# Patient Record
Sex: Male | Born: 1985 | Race: White | Hispanic: No | Marital: Single | Smoking: Former smoker
Health system: Southern US, Community
[De-identification: ages and names within clinical notes are randomized; demographics above are authoritative.]

---

## 2008-11-03 ENCOUNTER — Emergency Department (HOSPITAL_COMMUNITY): Admission: EM | Admit: 2008-11-03 | Discharge: 2008-11-03 | Payer: Self-pay | Admitting: Emergency Medicine

## 2014-10-22 ENCOUNTER — Emergency Department (INDEPENDENT_AMBULATORY_CARE_PROVIDER_SITE_OTHER)
Admission: EM | Admit: 2014-10-22 | Discharge: 2014-10-22 | Disposition: A | Discharge status: Home / Self Care | Payer: Managed Care, Other (non HMO) | Source: Home / Self Care | Attending: Family Medicine | Admitting: Family Medicine

## 2014-10-22 DIAGNOSIS — J029 Acute pharyngitis, unspecified: Secondary | ICD-10-CM

## 2014-10-22 LAB — POCT RAPID STREP A (OFFICE): Rapid Strep A Screen: NEGATIVE

## 2014-10-22 MED ORDER — PENICILLIN V POTASSIUM 500 MG PO TABS: ORAL_TABLET | ORAL | Status: DC

## 2014-10-22 NOTE — ED Notes (Signed)
Patient complains of sore throat, muscle pain x 1 day, state his nephew recently was diagnosed with strep throat

## 2014-10-22 NOTE — ED Provider Notes (Signed)
CSN: [id number]     Arrival date & time 10/22/14  1032 History   First MD Initiated Contact with Patient 10/22/14 1103     Chief Complaint  Patient presents with  . Sore Throat      HPI Comments: Yesterday patient developed dizziness, fatigue, sweats, myalgias, headache, nausea, and one episode of vomiting.  Today he awoke with sore throat.  No cough or nasal congestion.  His 29 year-old nephew has documented strep pharyngitis.  The history is provided by the patient.    History reviewed. No pertinent past medical history. History reviewed. No pertinent past surgical history. History reviewed. No pertinent family history. Social History  Substance Use Topics  . Smoking status: Former Games developer  . Smokeless tobacco: Never Used  . Alcohol Use: No    Review of Systems + sore throat No cough No pleuritic pain No wheezing No nasal congestion No post-nasal drainage No sinus pain/pressure No itchy/red eyes No earache + Dizziness No hemoptysis No SOB ? fever, + chills/sweats + nausea No vomiting + abdominal pain No diarrhea No urinary symptoms No skin rash + fatigue + myalgias + headache Used OTC meds without relief  Allergies  Review of patient's allergies indicates no known allergies.  Home Medications   Prior to Admission medications   Medication Sig Start Date End Date Taking? Authorizing Provider  penicillin v potassium (VEETID) 500 MG tablet Take one tab by mouth twice daily for 10 days 10/22/14   Lattie Haw, MD   Meds Ordered and Administered this Visit  Medications - No data to display  BP 102/58 mmHg  Pulse 82  Temp(Src) 98.3 F (36.8 C) (Oral)  Ht  (1.676 m)  Wt 108 lb (48.988 kg)  BMI 17.44 kg/m2  SpO2 100% No data found.   Physical Exam Nursing notes and Vital Signs reviewed. Appearance:  Patient appears stated age, and in no acute distress Eyes:  Pupils are equal, round, and reactive to light and accomodation.  Extraocular movement is  intact.  Conjunctivae are not inflamed  Ears:  Canals normal.  Tympanic membranes normal.  Nose:   Normal turbinates.  No sinus tenderness.   Pharynx:  Erythematous and slightly swollen without obstruction. Bilateral exudate.  Uvula erythematous. Neck:  Supple.  Tender shotty tonsillar nodes.  Shotty nontender posterior nodes. Lungs:  Clear to auscultation.  Breath sounds are equal.  Moving air well. Heart:  Regular rate and rhythm without murmurs, rubs, or gallops.  Abdomen:  Nontender without masses or hepatosplenomegaly.  Bowel sounds are present.  No CVA or flank tenderness.  Extremities:  No edema.   Skin:  No rash present.   ED Course  Procedures none    Labs Reviewed  POCT RAPID STREP A (OFFICE) - Normal  STREP A DNA PROBE     MDM   1. Acute pharyngitis, unspecified pharyngitis type    CENTOR score 4 Begin empiric penicillin VK Throat culture pending Try warm salt water gargles for sore throat.  May take Ibuprofen , 4 tabs every 8 hours with food for sore throat, headache, fever, etc. Followup with Family Doctor if not improved in 10 days.  Lattie Haw, MD 10/22/14 [phone]

## 2014-10-22 NOTE — Discharge Instructions (Signed)
Try warm salt water gargles for sore throat.  May take Ibuprofen , 4 tabs every 8 hours with food for sore throat, headache, fever, etc.   Salt Water Gargle This solution will help make your mouth and throat feel better. HOME CARE INSTRUCTIONS   Mix 1 teaspoon of salt in 8 ounces of warm water.  Gargle with this solution as much or often as you need or as directed. Swish and gargle gently if you have any sores or wounds in your mouth.  Do not swallow this mixture. Document Released: 11/02/2003 Document Revised: 04/22/2011 Document Reviewed: 03/25/2008 [facility] Patient Information 2015 [place], [place]. This information is not intended to replace advice given to you by your health care provider. Make sure you discuss any questions you have with your health care provider.

## 2014-10-23 LAB — STREP A DNA PROBE: GASP: POSITIVE

## 2015-02-22 ENCOUNTER — Encounter: Payer: Self-pay | Admitting: *Deleted

## 2015-02-22 ENCOUNTER — Emergency Department (INDEPENDENT_AMBULATORY_CARE_PROVIDER_SITE_OTHER)
Admission: EM | Admit: 2015-02-22 | Discharge: 2015-02-22 | Disposition: A | Discharge status: Home / Self Care | Payer: Managed Care, Other (non HMO) | Source: Home / Self Care | Attending: Family Medicine | Admitting: Family Medicine

## 2015-02-22 DIAGNOSIS — J069 Acute upper respiratory infection, unspecified: Secondary | ICD-10-CM

## 2015-02-22 DIAGNOSIS — B9789 Other viral agents as the cause of diseases classified elsewhere: Principal | ICD-10-CM

## 2015-02-22 MED ORDER — BENZONATATE 200 MG PO CAPS: 200.0000 mg | ORAL_CAPSULE | Freq: Every day | ORAL | Status: DC

## 2015-02-22 MED ORDER — AZITHROMYCIN 250 MG PO TABS: ORAL_TABLET | ORAL | Status: DC

## 2015-02-22 NOTE — ED Notes (Signed)
Pt c/o 2 days of runny nose, watery eyes, sneezing and cough. Taken Claritin and mucinex otc.

## 2015-02-22 NOTE — Discharge Instructions (Signed)
Continue plain guaifenesin (1200mg  extended release tabs such as Mucinex) twice daily, with plenty of water, for cough and congestion.  May add Pseudoephedrine (30mg , one or two every 4 to 6 hours) for sinus congestion.  Get adequate rest.   May use Afrin nasal spray (or generic oxymetazoline) twice daily for about 5 days and then discontinue.  Also recommend using saline nasal spray several times daily and saline nasal irrigation (AYR is a common brand).  Use Flonase nasal spray each morning after using Afrin nasal spray and saline nasal irrigation. May use Visine eye drops as needed. Try warm salt water gargles for sore throat.  Stop all antihistamines for now, and other non-prescription cough/cold preparations. May take Ibuprofen 200mg , 4 tabs every 8 hours with food for headache, fever, etc. Begin Azithromycin if not improving about one week or if persistent fever develops   Follow-up with family doctor if not improving about10 days.

## 2015-02-22 NOTE — ED Provider Notes (Signed)
CSN: [id number]     Arrival date & time 02/22/15  1344 History   First MD Initiated Contact with Patient 02/22/15 1433     Chief Complaint  Patient presents with  . Nasal Congestion  . Cough  . Eye Drainage      HPI Comments: Patient complains of two day history of typical cold-like symptoms including sinus congestion, headache, fatigue, sneezing, and cough.  No fevers, chills, and sweats   The history is provided by the patient.    History reviewed. No pertinent past medical history. History reviewed. No pertinent past surgical history. History reviewed. No pertinent family history. Social History  Substance Use Topics  . Smoking status: Former Games developermoker  . Smokeless tobacco: Never Used  . Alcohol Use: No    Review of Systems No sore throat + cough No pleuritic pain + wheezing + nasal congestion + post-nasal drainage ? sinus pain/pressure + watery eyes No earache No hemoptysis No SOB No fever, + chills + nausea No vomiting No abdominal pain No diarrhea No urinary symptoms No skin rash + fatigue + myalgias + headache Used OTC meds without relief  Allergies  Review of patient's allergies indicates no known allergies.  Home Medications   Prior to Admission medications   Medication Sig Start Date End Date Taking? Authorizing Provider  azithromycin (ZITHROMAX Z-PAK) 250 MG tablet Take 2 tabs today; then begin one tab once daily for 4 more days. (Rx void after 03/02/15) 02/22/15   Lattie HawStephen A Shafiq Larch, MD  benzonatate (TESSALON) 200 MG capsule Take 1 capsule (200 mg total) by mouth at bedtime. Take as needed for cough 02/22/15   Lattie HawStephen A Aleja Yearwood, MD   Meds Ordered and Administered this Visit  Medications - No data to display  BP 142/82 mmHg  Pulse 73  Temp(Src) 98.2 F (36.8 C) (Oral)  Resp 16  Wt 115 lb (52.164 kg)  SpO2 99% No data found.   Physical Exam Nursing notes and Vital Signs reviewed. Appearance:  Patient appears stated age, and in no acute  distress Eyes:  Pupils are equal, round, and reactive to light and accomodation.  Extraocular movement is intact.  Conjunctivae are not inflamed  Ears:  Canals normal.  Tympanic membranes normal.  Nose:  Congested turbinates.  No sinus tenderness.   Pharynx:  Normal Neck:  Supple.  Tender enlarged posterior nodes are palpated bilaterally  Lungs:  Clear to auscultation.  Breath sounds are equal.  Moving air well. Heart:  Regular rate and rhythm without murmurs, rubs, or gallops.  Abdomen:  Nontender without masses or hepatosplenomegaly.  Bowel sounds are present.  No CVA or flank tenderness.  Extremities:  No edema.  Skin:  No rash present.   ED Course  Procedures  None    MDM   1. Viral URI with cough    There is no evidence of bacterial infection today.  Treat symptomatically for now  Prescription written for Benzonatate (Tessalon) to take at bedtime for night-time cough.  Continue plain guaifenesin (1200mg  extended release tabs such as Mucinex) twice daily, with plenty of water, for cough and congestion.  May add Pseudoephedrine (30mg , one or two every 4 to 6 hours) for sinus congestion.  Get adequate rest.   May use Afrin nasal spray (or generic oxymetazoline) twice daily for about 5 days and then discontinue.  Also recommend using saline nasal spray several times daily and saline nasal irrigation (AYR is a common brand).  Use Flonase nasal spray each morning after using  Afrin nasal spray and saline nasal irrigation. May use Visine eye drops as needed. Try warm salt water gargles for sore throat.  Stop all antihistamines for now, and other non-prescription cough/cold preparations. May take Ibuprofen 200mg , 4 tabs every 8 hours with food for headache, fever, etc. Begin Azithromycin if not improving about one week or if persistent fever develops (Given a prescription to hold, with an expiration date)  Follow-up with family doctor if not improving about10 days.     Lattie Haw,  MD 02/22/15 [phone]

## 2015-07-05 DIAGNOSIS — S43432D Superior glenoid labrum lesion of left shoulder, subsequent encounter: Secondary | ICD-10-CM | POA: Insufficient documentation

## 2015-07-30 DIAGNOSIS — M19019 Primary osteoarthritis, unspecified shoulder: Secondary | ICD-10-CM | POA: Insufficient documentation

## 2016-04-18 ENCOUNTER — Encounter: Payer: Self-pay | Admitting: Osteopathic Medicine

## 2016-04-18 ENCOUNTER — Ambulatory Visit (INDEPENDENT_AMBULATORY_CARE_PROVIDER_SITE_OTHER): Payer: Managed Care, Other (non HMO) | Admitting: Osteopathic Medicine

## 2016-04-18 VITALS — BP 132/75 | HR 62 | Ht 66.0 in | Wt 124.0 lb

## 2016-04-18 DIAGNOSIS — Z008 Encounter for other general examination: Secondary | ICD-10-CM

## 2016-04-18 DIAGNOSIS — Z0189 Encounter for other specified special examinations: Secondary | ICD-10-CM

## 2016-04-18 DIAGNOSIS — R740 Nonspecific elevation of levels of transaminase and lactic acid dehydrogenase [LDH]: Secondary | ICD-10-CM

## 2016-04-18 DIAGNOSIS — Z0000 Encounter for general adult medical examination without abnormal findings: Secondary | ICD-10-CM | POA: Diagnosis not present

## 2016-04-18 DIAGNOSIS — R7401 Elevation of levels of liver transaminase levels: Secondary | ICD-10-CM

## 2016-04-18 NOTE — Progress Notes (Signed)
HPI: Luis Boyd is a 31 y.o. male  who presents to [facility] today, 04/18/16,  for chief complaint of:  Chief Complaint  Patient presents with  . Establish Care    BIOMETRIC SCREENING    Patient here for biometric screening and routine annual physical, no complaints.   Past medical, surgical, social and family history reviewed: There are no active problems to display for this patient.  History reviewed. No pertinent surgical history. Social History  Substance Use Topics  . Smoking status: Former Games developer  . Smokeless tobacco: Never Used  . Alcohol use No   History reviewed. No pertinent family history.   Current medication list and allergy/intolerance information reviewed:   No current outpatient prescriptions on file.   No current facility-administered medications for this visit.    No Known Allergies    Review of Systems:  Constitutional:  No  fever, no chills, No recent illness, No unintentional weight changes. No significant fatigue.   HEENT: No  headache, no vision change, no hearing change, No sore throat, No  sinus pressure  Cardiac: No  chest pain, No  pressure, No palpitations  Respiratory:  No  shortness of breath. No  Cough  Gastrointestinal: No  abdominal pain, No  nausea, No  vomiting,  No  blood in stool, No  diarrhea, No  constipation   Musculoskeletal: No new myalgia/arthralgia  Skin: No  Rash  Hem/Onc: No  easy bruising/bleeding, No  abnormal lymph node  Endocrine: No cold intolerance,  No heat intolerance.  Neurologic: No  weakness, No  dizziness, No  slurred speech/focal weakness/facial droop  Psychiatric: No  concerns with depression, No  concerns with anxiety, No sleep problems, No mood problems  Exam:  BP 132/75   Pulse 62   Ht 5\' 6"  (1.676 m)   Wt 124 lb (56.2 kg)   BMI 20.01 kg/m   Constitutional: VS see above. General Appearance: alert, well-developed, well-nourished, NAD  Eyes: Normal  lids and conjunctive, non-icteric sclera. PERRLA, mildly dilated...  Ears, Nose, Mouth, Throat: MMM, Normal external inspection ears/nares/mouth/lips/gums. TM normal bilaterally. Pharynx/tonsils no erythema, no exudate. Nasal mucosa normal.   Neck: No masses, trachea midline. No thyroid enlargement. No tenderness/mass appreciated. No lymphadenopathy  Respiratory: Normal respiratory effort. no wheeze, no rhonchi, no rales  Cardiovascular: S1/S2 normal, no murmur, no rub/gallop auscultated. RRR. No lower extremity edema.   Gastrointestinal: Nontender, no masses. No hepatomegaly, no splenomegaly. No hernia appreciated. Bowel sounds normal. Rectal exam deferred.   Musculoskeletal: Gait normal. No clubbing/cyanosis of digits.   Neurological: Normal balance/coordination. No tremor. No cranial nerve deficit on limited exam. Motor and sensation intact and symmetric. Cerebellar reflexes intact.   Skin: warm, dry, intact. No rash/ulcer.  Psychiatric: Normal judgment/insight. Normal mood and affect. Oriented x3.      ASSESSMENT/PLAN:   Encounter for biometric screening - Plan: COMPLETE METABOLIC PANEL WITH GFR, Lipid panel  Annual physical exam   MALE PREVENTIVE CARE  updated 04/18/16  ANNUAL SCREENING/COUNSELING  Any changes to health in the past year? no  Diet/Exercise - HEALTHY HABITS DISCUSSED TO DECREASE CV RISK History  Smoking Status  . Former Smoker  Smokeless Tobacco  . Never Used   History  Alcohol Use No   No flowsheet data found.  SEXUAL/REPRODUCTIVE HEALTH  STI testing needed/desired today? - no  Any concerns with testosterone/libido? - no  INFECTIOUS DISEASE SCREENING  HIV - needs - declined  GC/CT - needs - declined  HepC -  does not need  TB - does not need  CANCER SCREENING  Lung - USPSTF: 55-80yo w/ 30 py hx unless quit w/in 463yr - does not need  Colon - does not need  Prostate - does not need  OTHER DISEASE SCREENING  Lipid -  needs  DM2 - needs  ADULT VACCINATION  Influenza - annual vaccine recommended  Td - booster every 10 years   Zoster - option at 450, yes at 60+   PCV13 - was not indicated  PPSV23 - was not indicated Immunization History  Administered Date(s) Administered  . Influenza-Unspecified 12/27/2015  . Tdap 05/20/2015      Visit summary with medication list and pertinent instructions was printed for patient to review. All questions at time of visit were answered - patient instructed to contact office with any additional concerns. ER/RTC precautions were reviewed with the patient. Follow-up plan: Return in about 1 year (around 04/18/2017) [facility] sooner if needed.

## 2016-04-19 LAB — COMPLETE METABOLIC PANEL WITH GFR
ALBUMIN: 4.1 g/dL (ref 3.6–5.1)
ALT: 98 U/L — AB (ref 9–46)
AST: 57 U/L — AB (ref 10–40)
Alkaline Phosphatase: 38 U/L — ABNORMAL LOW (ref 40–115)
BUN: 13 mg/dL (ref 7–25)
CALCIUM: 9.1 mg/dL (ref 8.6–10.3)
CO2: 30 mmol/L (ref 20–31)
CREATININE: 1.16 mg/dL (ref 0.60–1.35)
Chloride: 100 mmol/L (ref 98–110)
GFR, Est African American: >89 mL/min (ref >=60)
GFR, Est Non African American: 84 mL/min (ref >=60)
GLUCOSE: 83 mg/dL (ref 65–99)
Potassium: 4.3 mmol/L (ref 3.5–5.3)
SODIUM: 140 mmol/L (ref 135–146)
TOTAL PROTEIN: 6.9 g/dL (ref 6.1–8.1)
Total Bilirubin: 0.7 mg/dL (ref 0.2–1.2)

## 2016-04-19 LAB — LIPID PANEL
CHOL/HDL RATIO: 4 ratio (ref <5.0)
CHOLESTEROL: 93 mg/dL (ref <200)
HDL: 23 mg/dL — ABNORMAL LOW (ref >40)
LDL Cholesterol: 60 mg/dL (ref <100)
Triglycerides: 51 mg/dL (ref <150)
VLDL: 10 mg/dL (ref <30)

## 2016-04-19 NOTE — Addendum Note (Signed)
Addended by: Deirdre PippinsALEXANDER, Legacy Lacivita M on: 04/19/2016 08:18 AM   Modules accepted: Orders

## 2017-05-06 ENCOUNTER — Ambulatory Visit (INDEPENDENT_AMBULATORY_CARE_PROVIDER_SITE_OTHER): Payer: 59 | Admitting: Osteopathic Medicine

## 2017-05-06 ENCOUNTER — Encounter: Payer: Self-pay | Admitting: Osteopathic Medicine

## 2017-05-06 VITALS — BP 124/75 | HR 63 | Temp 97.8°F | Ht 66.0 in | Wt 131.0 lb

## 2017-05-06 DIAGNOSIS — Z008 Encounter for other general examination: Secondary | ICD-10-CM

## 2017-05-06 DIAGNOSIS — Z0189 Encounter for other specified special examinations: Secondary | ICD-10-CM | POA: Diagnosis not present

## 2017-05-06 DIAGNOSIS — Z0000 Encounter for general adult medical examination without abnormal findings: Secondary | ICD-10-CM

## 2017-05-06 LAB — LIPID PANEL
CHOLESTEROL: 98 mg/dL (ref <200)
HDL: 39 mg/dL — AB (ref >40)
LDL CHOLESTEROL (CALC): 50 mg/dL
NON-HDL CHOLESTEROL (CALC): 59 mg/dL (ref <130)
Total CHOL/HDL Ratio: 2.5 (calc) (ref <5.0)
Triglycerides: 32 mg/dL (ref <150)

## 2017-05-06 LAB — COMPLETE METABOLIC PANEL WITH GFR
AG RATIO: 1.8 (calc) (ref 1.0–2.5)
ALT: 27 U/L (ref 9–46)
AST: 27 U/L (ref 10–40)
Albumin: 4.4 g/dL (ref 3.6–5.1)
Alkaline phosphatase (APISO): 50 U/L (ref 40–115)
BILIRUBIN TOTAL: 0.8 mg/dL (ref 0.2–1.2)
BUN: 20 mg/dL (ref 7–25)
CO2: 28 mmol/L (ref 20–32)
Calcium: 9.2 mg/dL (ref 8.6–10.3)
Chloride: 103 mmol/L (ref 98–110)
Creat: 1.14 mg/dL (ref 0.60–1.35)
GFR, EST AFRICAN AMERICAN: 99 mL/min/{1.73_m2} (ref >=60)
GFR, Est Non African American: 85 mL/min/{1.73_m2} (ref >=60)
Globulin: 2.5 g/dL (calc) (ref 1.9–3.7)
Glucose, Bld: 87 mg/dL (ref 65–99)
Potassium: 4 mmol/L (ref 3.5–5.3)
Sodium: 139 mmol/L (ref 135–146)
TOTAL PROTEIN: 6.9 g/dL (ref 6.1–8.1)

## 2017-05-06 LAB — CBC
HCT: 43.4 % (ref 38.5–50.0)
Hemoglobin: 15.1 g/dL (ref 13.2–17.1)
MCH: 31.7 pg (ref 27.0–33.0)
MCHC: 34.8 g/dL (ref 32.0–36.0)
MCV: 91.2 fL (ref 80.0–100.0)
MPV: 9.1 fL (ref 7.5–12.5)
PLATELETS: 241 10*3/uL (ref 140–400)
RBC: 4.76 10*6/uL (ref 4.20–5.80)
RDW: 12.6 % (ref 11.0–15.0)
WBC: 5.3 10*3/uL (ref 3.8–10.8)

## 2017-05-06 NOTE — Progress Notes (Signed)
HPI: Luis Boyd is a 32 y.o. male  who presents to [facility] today, 05/06/17,  for chief complaint of:  Annual checkup and biometric screening   Patient here for biometric screening and routine annual physical, no complaints. See below for review of preventive care.   (+)PHQ, pt is not too worried about things, just feeling a little down at times. We talked about the options for medications +/- counseling, he'd like to think about this for now.   Past medical, surgical, social and family history reviewed: There are no active problems to display for this patient.  No past surgical history on file. Social History   Tobacco Use  . Smoking status: Former Games developer  . Smokeless tobacco: Never Used  Substance Use Topics  . Alcohol use: No   Family History  Problem Relation Age of Onset  . Hypertension Brother      Current medication list and allergy/intolerance information reviewed:   No current outpatient medications on file.   No current facility-administered medications for this visit.    No Known Allergies    Review of Systems:  Constitutional:  No  fever, no chills, No recent illness, No unintentional weight changes. No significant fatigue.   HEENT: No  headache, no vision change, no hearing change, No sore throat, No  sinus pressure  Cardiac: No  chest pain, No  pressure, No palpitations  Respiratory:  No  shortness of breath. No  Cough  Gastrointestinal: No  abdominal pain, No  nausea, No  vomiting,  No  blood in stool, No  diarrhea, No  constipation   Musculoskeletal: No new myalgia/arthralgia  Skin: No  Rash  Hem/Onc: No  easy bruising/bleeding, No  abnormal lymph node  Endocrine: No cold intolerance,  No heat intolerance.  Neurologic: No  weakness, No  dizziness, No  slurred speech/focal weakness/facial droop  Psychiatric: +concerns with depression, No  concerns with anxiety, No sleep problems, No mood  problems  Depression screen [facility] 2/9 05/06/2017  Decreased Interest 2  Down, Depressed, Hopeless 2  PHQ - 2 Score 4  Altered sleeping 0  Tired, decreased energy 1  Change in appetite 0  Feeling bad or failure about yourself  0  Trouble concentrating 0  Moving slowly or fidgety/restless 0  Suicidal thoughts 0  PHQ-9 Score 5  Difficult doing work/chores Not difficult at all     Exam:  BP 124/75 (BP Location: Left Arm, Patient Position: Sitting, Cuff Size: Normal)   Pulse 63   Temp 97.8 F (36.6 C) (Oral)   Ht 5\' 6"  (1.676 m)   Wt 131 lb 0.6 oz (59.4 kg)   BMI 21.15 kg/m   Constitutional: VS see above. General Appearance: alert, well-developed, well-nourished, NAD  Eyes: Normal lids and conjunctive, non-icteric sclera. PERRLA, mildly dilated...  Ears, Nose, Mouth, Throat: MMM, Normal external inspection ears/nares/mouth/lips/gums. TM normal bilaterally. Pharynx/tonsils no erythema, no exudate. Nasal mucosa normal.   Neck: No masses, trachea midline. No thyroid enlargement. No tenderness/mass appreciated. No lymphadenopathy  Respiratory: Normal respiratory effort. no wheeze, no rhonchi, no rales  Cardiovascular: S1/S2 normal, no murmur, no rub/gallop auscultated. RRR. No lower extremity edema.   Gastrointestinal: Nontender, no masses. No hepatomegaly, no splenomegaly. No hernia appreciated. Bowel sounds normal. Rectal exam deferred.   Musculoskeletal: Gait normal. No clubbing/cyanosis of digits.   Neurological: Normal balance/coordination. No tremor. No cranial nerve deficit on limited exam. Motor and sensation intact and symmetric. Cerebellar reflexes intact.   Skin: warm,  dry, intact. No rash/ulcer.  Psychiatric: Normal judgment/insight. Normal mood and affect. Oriented x3.      ASSESSMENT/PLAN:   Annual physical exam - Plan: CBC, COMPLETE METABOLIC PANEL WITH GFR, Lipid panel  Encounter for biometric screening - Plan: CBC, COMPLETE METABOLIC PANEL WITH GFR, Lipid  panel  Patient Instructions  Plan: If you're doing well, come see me in a year!  If depression/mood is something you'd like to talk about more, please come see me and we can go over medication options, counseling, etc.       MALE PREVENTIVE CARE  updated 05/06/17  ANNUAL SCREENING/COUNSELING  Any changes to health in the past year? no  Diet/Exercise - HEALTHY HABITS DISCUSSED TO DECREASE CV RISK Social History   Tobacco Use  Smoking Status Former Smoker  Smokeless Tobacco Never Used   Social History   Substance and Sexual Activity  Alcohol Use No   No flowsheet data found.  SEXUAL/REPRODUCTIVE HEALTH  STI testing needed/desired today? - no  Any concerns with testosterone/libido? - no  INFECTIOUS DISEASE SCREENING  HIV - needs - declined  GC/CT - needs - declined  HepC - does not need  TB - does not need  CANCER SCREENING  Lung - does not need  Colon - does not need  Prostate - does not need  OTHER DISEASE SCREENING  Lipid - needs  DM2 - needs  ADULT VACCINATION  Influenza - annual vaccine recommended  Td - booster every 10 years   Zoster - option at 3050, yes at 60+   PCV13 - was not indicated  PPSV23 - was not indicated Immunization History  Administered Date(s) Administered  . Influenza-Unspecified 12/27/2015, 12/12/2016  . Tdap 05/20/2015      Visit summary with medication list and pertinent instructions was printed for patient to review. All questions at time of visit were answered - patient instructed to contact office with any additional concerns. ER/RTC precautions were reviewed with the patient. Follow-up plan: Return in about 1 year (around 05/07/2018) for annual physical, sooner if needed .

## 2017-05-06 NOTE — Patient Instructions (Signed)
Plan: If you're doing well, come see me in a year!  If depression/mood is something you'd like to talk about more, please come see me and we can go over medication options, counseling, etc.

## 2017-07-18 ENCOUNTER — Encounter: Payer: Self-pay | Admitting: Physician Assistant

## 2017-07-18 ENCOUNTER — Ambulatory Visit (INDEPENDENT_AMBULATORY_CARE_PROVIDER_SITE_OTHER): Payer: 59 | Admitting: Physician Assistant

## 2017-07-18 VITALS — BP 125/87 | HR 75 | Temp 98.0°F | Resp 14 | Wt 126.0 lb

## 2017-07-18 DIAGNOSIS — J069 Acute upper respiratory infection, unspecified: Secondary | ICD-10-CM | POA: Diagnosis not present

## 2017-07-18 LAB — POCT RAPID STREP A (OFFICE): RAPID STREP A SCREEN: NEGATIVE

## 2017-07-18 MED ORDER — BENZONATATE 200 MG PO CAPS
200.0000 mg | ORAL_CAPSULE | Freq: Three times a day (TID) | ORAL | 0 refills | Status: DC | PRN
Start: 2017-07-18 — End: 2018-07-10

## 2017-07-18 NOTE — Patient Instructions (Signed)
For sore throat: - Tylenol 1000mg  every 8 hours as needed for throat pain. Alternate with Ibuprofen 600mg  every 6 hours - Cepacol throat lozenges and/or Chloraseptic spray - Warm salt water gargles  For cough: - Mucinex with at least 8 oz. of water to make cough more productive - Delsym or Roitussin to help suppress cough  Note: follow package instructions for all over-the-counter medications. If using multi-symptom medications (Dayquil, Theraflu, etc.), check the label for duplicate drug ingredients.

## 2017-07-18 NOTE — Progress Notes (Signed)
HPI:                                                                Luis PaliWilliam D Boyd is a 32 y.o. male [facility] today for URI  URI   This is a new problem. The current episode started in the past 7 days (x3 days). The problem has been unchanged. The maximum temperature recorded prior to his arrival was 102 - 102.9 F. Associated symptoms include coughing and a sore throat. Pertinent negatives include no congestion, diarrhea, ear pain, nausea, rash, rhinorrhea or wheezing. Associated symptoms comments: + diaphoresis + malaise + myalgia. Treatments tried: Nyquil, Dayquil.    History reviewed. No pertinent past medical history. History reviewed. No pertinent surgical history. Social History   Tobacco Use  . Smoking status: Former Smoker    Last attempt to quit: 2012    Years since quitting: 7.4  . Smokeless tobacco: Never Used  Substance Use Topics  . Alcohol use: No   family history includes Hypertension in his brother.    ROS: negative except as noted in the HPI  Medications: Current Outpatient Medications  Medication Sig Dispense Refill  . benzonatate (TESSALON) 200 MG capsule Take 1 capsule (200 mg total) by mouth 3 (three) times daily as needed for cough. 30 capsule 0   No current facility-administered medications for this visit.    No Known Allergies     Objective:  BP 125/87   Pulse 75   Temp 98 F (36.7 C) (Oral)   Resp 14   Wt 126 lb (57.2 kg)   SpO2 99%   BMI 20.34 kg/m  Gen:  alert, not ill-appearing, no distress, appropriate for age HEENT: head normocephalic without obvious abnormality, conjunctiva and cornea clear, wearing glasses, oropharynx with mild erythema, no edema or exudates, uvula midline, neck supple, no cervical adenopathy, mucous membranes are dry, trachea midline Pulm: Normal work of breathing, normal phonation, clear to auscultation bilaterally, no wheezes, rales or  rhonchi CV: Normal rate, regular rhythm, s1 and s2 distinct, no murmurs, clicks or rubs  Neuro: alert and oriented x 3, no tremor MSK: extremities atraumatic, normal gait and station Skin: intact, no rashes on exposed skin, no jaundice, no cyanosis    Results for orders placed or performed in visit on 07/18/17 (from the past 72 hour(s))  POCT rapid strep A     Status: Normal   Collection Time: 07/18/17 11:45 AM  Result Value Ref Range   Rapid Strep A Screen Negative Negative   No results found.    Assessment and Plan: 32 y.o. male with   Acute upper respiratory infection - Plan: benzonatate (TESSALON) 200 MG capsule, POCT rapid strep A - POCT Rapid Strep A negative. Afebrile, no tachycardia, no tachypnea.  - Supportive care. Encouraged PO hydration     Patient education and anticipatory guidance given Patient agrees with treatment plan Follow-up as needed if symptoms worsen or fail to improve  Levonne Hubertharley E. Cummings PA-C

## 2018-07-10 ENCOUNTER — Ambulatory Visit (INDEPENDENT_AMBULATORY_CARE_PROVIDER_SITE_OTHER): Payer: 59 | Admitting: Family Medicine

## 2018-07-10 ENCOUNTER — Encounter: Payer: Self-pay | Admitting: Family Medicine

## 2018-07-10 VITALS — BP 130/79 | HR 61 | Temp 97.6°F | Wt 121.0 lb

## 2018-07-10 DIAGNOSIS — S39012A Strain of muscle, fascia and tendon of lower back, initial encounter: Secondary | ICD-10-CM | POA: Diagnosis not present

## 2018-07-10 MED ORDER — CYCLOBENZAPRINE HCL 10 MG PO TABS: 10.0000 mg | ORAL_TABLET | Freq: Three times a day (TID) | ORAL | 0 refills | Status: DC | PRN

## 2018-07-10 MED ORDER — NAPROXEN 500 MG PO TABS: 500.0000 mg | ORAL_TABLET | Freq: Two times a day (BID) | ORAL | 3 refills | Status: DC

## 2018-07-10 NOTE — Patient Instructions (Signed)
Thank you for coming in today. Attend PT.  Use heating pad, and TENS unit.  Take naproxen twice daily as needed.  Use cyclobenzaprine muscle relaxer 3x daily as needed (mostly at bedtime as it can make you sleepy).   TENS UNIT: This is helpful for muscle pain and spasm.   Search and Purchase a TENS 7000 2nd edition at  www.tenspros.com or www.Amazon.com It should be less than $30.     TENS unit instructions: Do not shower or bathe with the unit on Turn the unit off before removing electrodes or batteries If the electrodes lose stickiness add a drop of water to the electrodes after they are disconnected from the unit and place on plastic sheet. If you continued to have difficulty, call the TENS unit company to purchase more electrodes. Do not apply lotion on the skin area prior to use. Make sure the skin is clean and dry as this will help prolong the life of the electrodes. After use, always check skin for unusual red areas, rash or other skin difficulties. If there are any skin problems, does not apply electrodes to the same area. Never remove the electrodes from the unit by pulling the wires. Do not use the TENS unit or electrodes other than as directed. Do not change electrode placement without consultating your therapist or physician. Keep 2 fingers with between each electrode. Wear time ratio is 2:1, on to off times.    For example on for 30 minutes off for 15 minutes and then on for 30 minutes off for 15 minutes     Lumbosacral Strain Lumbosacral strain is an injury that causes pain in the lower back (lumbosacral spine). This injury usually occurs from overstretching the muscles or ligaments along your spine. A strain can affect one or more muscles or cord-like tissues that connect bones to other bones (ligaments). What are the causes? This condition may be caused by:  A hard, direct hit (blow) to the back.  Excessive stretching of the lower back muscles. This may result  from: ? A fall. ? Lifting something heavy. ? Repetitive movements such as bending or crouching. What increases the risk? The following factors may increase your risk of getting this condition:  Participating in sports or activities that involve: ? A sudden twist of the back. ? Pushing or pulling motions.  Being overweight or obese.  Having poor strength and flexibility, especially tight hamstrings or weak muscles in the back or abdomen.  Having too much of a curve in the lower back.  Having a pelvis that is tilted forward. What are the signs or symptoms? The main symptom of this condition is pain in the lower back, at the site of the strain. Pain may extend (radiate) down one or both legs. How is this diagnosed? This condition is diagnosed based on:  Your symptoms.  Your medical history.  A physical exam. ? Your health care provider may push on certain areas of your back to determine the source of your pain. ? You may be asked to bend forward, backward, and side to side to assess the severity of your pain and your range of motion.  Imaging tests, such as: ? X-rays. ? MRI.  How is this treated? Treatment for this condition may include:  Putting heat and cold on the affected area.  Medicines to help relieve pain and relax your muscles (muscle relaxants).  NSAIDs to help reduce swelling and discomfort. When your symptoms improve, it is important to gradually return  to your normal routine as soon as possible to reduce pain, avoid stiffness, and avoid loss of muscle strength. Generally, symptoms should improve within 6 weeks of treatment. However, recovery time varies. Follow these instructions at home: Managing pain, stiffness, and swelling   If directed, put ice on the injured area during the first 24 hours after your strain. ? Put ice in a plastic bag. ? Place a towel between your skin and the bag. ? Leave the ice on for 20 minutes, 2-3 times a day.  If directed,  put heat on the affected area as often as told by your health care provider. Use the heat source that your health care provider recommends, such as a moist heat pack or a heating pad. ? Place a towel between your skin and the heat source. ? Leave the heat on for 20-30 minutes. ? Remove the heat if your skin turns bright red. This is especially important if you are unable to feel pain, heat, or cold. You may have a greater risk of getting burned. Activity  Rest and return to your normal activities as told by your health care provider. Ask your health care provider what activities are safe for you.  Avoid activities that take a lot of energy for as long as told by your health care provider. General instructions  Take over-the-counter and prescription medicines only as told by your health care provider.  Donot drive or use heavy machinery while taking prescription pain medicine.  Do not use any products that contain nicotine or tobacco, such as cigarettes and e-cigarettes. If you need help quitting, ask your health care provider.  Keep all follow-up visits as told by your health care provider. This is important. How is this prevented?  Use correct form when playing sports and lifting heavy objects.  Use good posture when sitting and standing.  Maintain a healthy weight.  Sleep on a mattress with medium firmness to support your back.  Be safe and responsible while being active to avoid falls.  Do at least 150 minutes of moderate-intensity exercise each week, such as brisk walking or water aerobics. Try a form of exercise that takes stress off your back, such as swimming or stationary cycling.  Maintain physical fitness, including: ? Strength. ? Flexibility. ? Cardiovascular fitness. ? Endurance. Contact a health care provider if:  Your back pain does not improve after 6 weeks of treatment.  Your symptoms get worse. Get help right away if:  Your back pain is severe.  You  cannot stand or walk.  You have difficulty controlling when you urinate or when you have a bowel movement.  You feel nauseous or you vomit.  Your feet get very cold.  You have numbness, tingling, weakness, or problems using your arms or legs.  You develop any of the following: ? Shortness of breath. ? Dizziness. ? Pain in your legs. ? Weakness in your buttocks or legs. ? Discoloration of the skin on your toes or legs. This information is not intended to replace advice given to you by your health care provider. Make sure you discuss any questions you have with your health care provider. Document Released: 11/07/2004 Document Revised: 08/18/2015 Document Reviewed: 07/02/2015 Elsevier Interactive Patient Education  2019 [employer].

## 2018-07-10 NOTE — Progress Notes (Signed)
Luis Boyd is a 33 y.o. male who presents to [facility] today for lower back pain.  Patient about 2 weeks ago developed a little bit of pain in his left lower back.  He notes 2 days ago it worsened quite a bit.  He notes now moderate to severe pain in her left lower back.  He denies any radiating pain weakness or numbness bowel bladder dysfunction.  Pain is worse with activity and motion.  He is tried an over-the-counter back brace and Goody powder which did not help much.  He called out of work today because his back pain was getting worse.  He is never had any thing like this happen before.  He feels well otherwise.    ROS:  As above  Exam:  BP 130/79   Pulse 61   Temp 97.6 F (36.4 C) (Oral)   Wt 121 lb (54.9 kg)   BMI 19.53 kg/m  Wt Readings from Last 5 Encounters:  07/10/18 121 lb (54.9 kg)  07/18/17 126 lb (57.2 kg)  05/06/17 131 lb 0.6 oz (59.4 kg)  04/18/16 124 lb (56.2 kg)  02/22/15 115 lb (52.2 kg)   General: Well Developed, well nourished, and in no acute distress.  Neuro/Psych: Alert and oriented x3, extra-ocular muscles intact, able to move all 4 extremities, sensation grossly intact. Skin: Warm and dry, no rashes noted.  Respiratory: Not using accessory muscles, speaking in full sentences, trachea midline.  Cardiovascular: Pulses palpable, no extremity edema. Abdomen: Does not appear distended. MSK: L-spine: Nontender to spinal midline.  Tender palpation left lumbar paraspinal musculature. Limited lumbar motion. Lower extremity strength reflexes and sensation are equal and normal throughout bilateral lower extremities. Negative slump test. Normal gait.     Assessment and Plan: 33 y.o. male with lumbosacral strain and spasm.  Plan for treatment with NSAIDs cyclobenzaprine TENS unit heating pad and physical therapy.  Work note provided.  Discussed cause of pain anatomy and next steps in treatment plan.  Recheck  if not improving.   PDMP not reviewed this encounter. Orders Placed This Encounter  Procedures  . Ambulatory referral to Physical Therapy    Referral Priority:   Routine    Referral Type:   Physical Medicine    Referral Reason:   Specialty Services Required    Requested Specialty:   Physical Therapy   Meds ordered this encounter  Medications  . naproxen (NAPROSYN) 500 MG tablet    Sig: Take 1 tablet (500 mg total) by mouth 2 (two) times daily with a meal.    Dispense:  60 tablet    Refill:  3  . cyclobenzaprine (FLEXERIL) 10 MG tablet    Sig: Take 1 tablet (10 mg total) by mouth 3 (three) times daily as needed for muscle spasms.    Dispense:  30 tablet    Refill:  0    Historical information moved to improve visibility of documentation.  History reviewed. No pertinent past medical history. History reviewed. No pertinent surgical history. Social History   Tobacco Use  . Smoking status: Former Smoker    Last attempt to quit: 2012    Years since quitting: 8.4  . Smokeless tobacco: Never Used  Substance Use Topics  . Alcohol use: No   family history includes Hypertension in his brother.  Medications: Current Outpatient Medications  Medication Sig Dispense Refill  . cyclobenzaprine (FLEXERIL) 10 MG tablet Take 1 tablet (10 mg total) by mouth 3 (three) times  daily as needed for muscle spasms. 30 tablet 0  . naproxen (NAPROSYN) 500 MG tablet Take 1 tablet (500 mg total) by mouth 2 (two) times daily with a meal. 60 tablet 3   No current facility-administered medications for this visit.    No Known Allergies    Discussed warning signs or symptoms. Please see discharge instructions. Patient expresses understanding.

## 2018-07-23 ENCOUNTER — Ambulatory Visit: Payer: 59 | Admitting: Rehabilitative and Restorative Service Providers"

## 2018-08-27 ENCOUNTER — Encounter: Payer: Self-pay | Admitting: Osteopathic Medicine

## 2018-08-27 ENCOUNTER — Other Ambulatory Visit: Payer: Self-pay

## 2018-08-27 ENCOUNTER — Ambulatory Visit (INDEPENDENT_AMBULATORY_CARE_PROVIDER_SITE_OTHER): Payer: 59 | Admitting: Osteopathic Medicine

## 2018-08-27 VITALS — BP 118/73 | HR 57 | Temp 97.8°F | Wt 122.9 lb

## 2018-08-27 DIAGNOSIS — S39012D Strain of muscle, fascia and tendon of lower back, subsequent encounter: Secondary | ICD-10-CM | POA: Diagnosis not present

## 2018-08-27 DIAGNOSIS — S161XXA Strain of muscle, fascia and tendon at neck level, initial encounter: Secondary | ICD-10-CM

## 2018-08-27 DIAGNOSIS — S39012A Strain of muscle, fascia and tendon of lower back, initial encounter: Secondary | ICD-10-CM | POA: Insufficient documentation

## 2018-08-27 MED ORDER — MELOXICAM 15 MG PO TABS: 15.0000 mg | ORAL_TABLET | Freq: Every day | ORAL | 0 refills | Status: DC

## 2018-08-27 MED ORDER — PREDNISONE 20 MG PO TABS: 20.0000 mg | ORAL_TABLET | Freq: Two times a day (BID) | ORAL | 0 refills | Status: DC

## 2018-08-27 MED ORDER — CYCLOBENZAPRINE HCL 10 MG PO TABS: 10.0000 mg | ORAL_TABLET | Freq: Three times a day (TID) | ORAL | 0 refills | Status: DC | PRN

## 2018-08-27 NOTE — Patient Instructions (Signed)
Plan:  See home exercise instructions   Stop Naproxen, will trial Mobic/meloxicam   Will trial steroid burst to see if we can get things to calm down  I refilled the Flexeril/cyclobenzaprine muscle relaxers  Check with physical therapy office down the hall   Call if not better, we can get MRI / refer to specialist if needed!

## 2018-08-27 NOTE — Progress Notes (Signed)
HPI: Luis Boyd is a 33 y.o. male who  has no past medical history on file.  he presents to [facility] [place] today, 08/27/18,  for chief complaint of:  Pain in back  . 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 Location: lower back, started radiating now to neck  . Quality: sore, tight  . Duration: 6+ weeks  Modifying factors: Naproxen helped a bit, muscle relaxers helped but he's out   At today's visit 08/27/18 ... PMH, PSH, FH reviewed and updated as needed.  Current medication list and allergy/intolerance hx reviewed and updated as needed. (See remainder of HPI, ROS, Phys Exam below)   No results found.  No results found for this or any previous visit (from the past 72 hour(s)).        ASSESSMENT/PLAN: The primary encounter diagnosis was Strain of lumbar region, subsequent encounter. A diagnosis of Strain of neck muscle, initial encounter was also pertinent to this visit.     Seems like repetitive overuse injury as opposed to skeletal/joint issue, would consider imaging but I really do not think necessary at this point.  Patient was not able to arrange for physical therapy due to financial issues, home exercises were provided today and will adjust medications.  Patient is advised he can return for OMT as needed.   OMT applied: MFR and FPR to cervical spine, thoracic spine, lumbar spine.  Some relief to cervical spine area.  Patient had some difficulty relaxing so procedure was a bit challenging.   No orders of the defined types were placed in this encounter.    Meds ordered this encounter  Medications  . meloxicam (MOBIC) 15 MG tablet    Sig: Take 1 tablet (15 mg total) by mouth daily. Take every day for 1-2 weeks then as needed for back pain after that    Dispense:  30 tablet    Refill:  0  . predniSONE (DELTASONE) 20 MG tablet    Sig: Take 1 tablet (20 mg total) by mouth 2  (two) times daily with a meal.    Dispense:  10 tablet    Refill:  0  . cyclobenzaprine (FLEXERIL) 10 MG tablet    Sig: Take 1 tablet (10 mg total) by mouth 3 (three) times daily as needed for muscle spasms.    Dispense:  90 tablet    Refill:  0    Patient Instructions  Plan:  See home exercise instructions   Stop Naproxen, will trial Mobic/meloxicam   Will trial steroid burst to see if we can get things to calm down  I refilled the Flexeril/cyclobenzaprine muscle relaxers  Check with physical therapy office down the hall   Call if not better, we can get MRI / refer to specialist if needed!         Follow-up plan: Return if symptoms worsen or fail to improve within 2 weeks, call us! We can get set up for MRI if needed..                                                 ################################################# ################################################# ################################################# #################################################    Current Meds  Medication Sig  . cyclobenzaprine (FLEXERIL) 10 MG tablet Take 1 tablet (10 mg total) by mouth 3 (  three) times daily as needed for muscle spasms.  . [DISCONTINUED] cyclobenzaprine (FLEXERIL) 10 MG tablet Take 1 tablet (10 mg total) by mouth 3 (three) times daily as needed for muscle spasms.  . [DISCONTINUED] naproxen (NAPROSYN) 500 MG tablet Take 1 tablet (500 mg total) by mouth 2 (two) times daily with a meal.    No Known Allergies     Review of Systems:  Constitutional: No recent illness  HEENT: No  headache, no vision change  Cardiac: No  chest pain, No  pressure, No palpitations  Respiratory:  No  shortness of breath. No  Cough  Gastrointestinal: No  abdominal painhabits  Musculoskeletal: + myalgia/arthralgia  Skin: No  Rash  Neurologic: No  weakness, No  Dizziness   Exam:  BP 118/73 (BP Location: Left Arm, Patient  Position: Sitting, Cuff Size: Normal)   Pulse (!) 57   Temp 97.8 F (36.6 C) (Oral)   Wt 122 lb 14.4 oz (55.7 kg)   BMI 19.84 kg/m   Constitutional: VS see above. General Appearance: alert, well-developed, well-nourished, NAD  Eyes: Normal lids and conjunctive, non-icteric sclera  Ears, Nose, Mouth, Throat: MMM, Normal external inspection ears/nares/mouth/lips/gums.  Neck: No masses, trachea midline.   Respiratory: Normal respiratory effort. no wheeze, no rhonchi, no rales  Cardiovascular: S1/S2 normal, no murmur, no rub/gallop auscultated. RRR.   Musculoskeletal: Gait normal. Symmetric and independent movement of all extremities.  Carries right shoulder a bit higher.  Paraspinal tenderness worse on left para spinal thoracic area and rhomboids, tenderness to palpation/ropey texture to cervical paraspinal musculature bilaterally  Neurological: Normal balance/coordination. No tremor.  Skin: warm, dry, intact.   Psychiatric: Normal judgment/insight. Normal mood and affect. Oriented x3.       Visit summary with medication list and pertinent instructions was printed for patient to review, patient was advised to alert [place] if any updates are needed. All questions at time of visit were answered - patient instructed to contact office with any additional concerns. ER/RTC precautions were reviewed with the patient and understanding verbalized.    Please note: voice recognition software was used to produce this document, and typos may escape review. Please contact Dr. Sheppard Coil for any needed clarifications.    Follow up plan: Return if symptoms worsen or fail to improve within 2 weeks, call us! We can get set up for MRI if needed.Marland Kitchen

## 2018-09-15 ENCOUNTER — Ambulatory Visit (INDEPENDENT_AMBULATORY_CARE_PROVIDER_SITE_OTHER): Payer: 59 | Admitting: Physician Assistant

## 2018-09-15 ENCOUNTER — Other Ambulatory Visit: Payer: Self-pay

## 2018-09-15 VITALS — Temp 98.7°F | Ht 66.0 in | Wt 126.0 lb

## 2018-09-15 DIAGNOSIS — R110 Nausea: Secondary | ICD-10-CM | POA: Diagnosis not present

## 2018-09-15 DIAGNOSIS — R509 Fever, unspecified: Secondary | ICD-10-CM

## 2018-09-15 DIAGNOSIS — Z20822 Contact with and (suspected) exposure to covid-19: Secondary | ICD-10-CM

## 2018-09-15 DIAGNOSIS — R21 Rash and other nonspecific skin eruption: Secondary | ICD-10-CM | POA: Diagnosis not present

## 2018-09-15 DIAGNOSIS — R52 Pain, unspecified: Secondary | ICD-10-CM

## 2018-09-15 NOTE — Progress Notes (Signed)
Patient ID: Luis Boyd, male   DOB: Jun 27, 1985, 33 y.o.   MRN: [id number] .Marland KitchenVirtual Visit via Video Note  I connected with Luis Boyd on 09/15/18 at  1:00 PM EDT by a video enabled telemedicine application and verified that I am speaking with the correct person using two identifiers.  Location: Patient: home Provider: clinic   I discussed the limitations of evaluation and management by telemedicine and the availability of in person appointments. The patient expressed understanding and agreed to proceed.  History of Present Illness: Pt is a 33 yo male who calls into the clinic to discuss fever, body aches, nausea, sweats that started Sunday night, 2 days ago. His last fever was this morning. He has not been to work since symptoms started. His nephew was diagnosed with hand, foot and mouth. He notices some tender red areas on feet but no where else. He denies any cough, SOB, GI symptoms, loss of smell or taste. His whole body is completely sore.  He does have a little bit of a sore throat. He is taking tylenol/ibuprofen. Pt needs work note faxed to [phone].   .. Active Ambulatory Problems    Diagnosis Date Noted  . 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 Labral tear of shoulder, left, subsequent encounter 07/05/2015  . Strain of lumbar region 08/27/2018  . Cervical strain 08/27/2018   Resolved Ambulatory Problems    Diagnosis Date Noted  . No Resolved Ambulatory Problems   No Additional Past Medical History   Reviewed med, allergy, problem list.    Observations/Objective: No acute distress. Normal breathing. Pale appearance.  Normal mood.  Not able to visualize rash on phone screen.  No edema in hands or feet noted.   .. Today's Vitals   09/15/18 1142  Temp: 98.7 F (37.1 C)  TempSrc: Oral  Weight: 126 lb (57.2 kg)  Height: 5\' 6"  (1.676 m)   Body mass index is 20.34 kg/m.    Assessment and Plan: Marland KitchenMarland KitchenGabino was seen today for generalized body  aches.  Diagnoses and all orders for this visit:  Fever and chills  Body aches  Nausea  Rash   Due to symptoms patient needs to be covid tested.  Discussed to go to [facility] drive by testing center today.  Will write out of work until test results are received.  Discussed to self isolate.  Discussed also symptoms could be due to other viral illnesses. Use tylenol for pain and fever control. If develops any painful spots in mouth we could send a mouth rinse. If he starts seeing more spots more like this is caused by coxsackievirus which is contagious as well.   Follow Up Instructions:    I discussed the assessment and treatment plan with the patient. The patient was provided an opportunity to ask questions and all were answered. The patient agreed with the plan and demonstrated an understanding of the instructions.   The patient was advised to call back or seek an in-person evaluation if the symptoms worsen or if the condition fails to improve as anticipated.     Iran Planas, PA-C

## 2018-09-15 NOTE — Progress Notes (Deleted)
Nephew has hand, foot, mouth Some spots on feet, no other rashes  Sweats, Body aches, Nausea - started Sunday night  Wants note from work, was out yesterday and today

## 2018-09-16 ENCOUNTER — Encounter: Payer: Self-pay | Admitting: Physician Assistant

## 2018-09-16 LAB — NOVEL CORONAVIRUS, NAA: SARS-CoV-2, NAA: NOT DETECTED

## 2018-09-17 ENCOUNTER — Encounter: Payer: Self-pay | Admitting: Neurology

## 2019-02-24 ENCOUNTER — Emergency Department (INDEPENDENT_AMBULATORY_CARE_PROVIDER_SITE_OTHER)
Admission: EM | Admit: 2019-02-24 | Discharge: 2019-02-24 | Disposition: A | Discharge status: Home / Self Care | Payer: 59 | Source: Home / Self Care

## 2019-02-24 ENCOUNTER — Encounter: Payer: Self-pay | Admitting: Family Medicine

## 2019-02-24 ENCOUNTER — Other Ambulatory Visit: Payer: Self-pay

## 2019-02-24 DIAGNOSIS — R0989 Other specified symptoms and signs involving the circulatory and respiratory systems: Secondary | ICD-10-CM | POA: Diagnosis not present

## 2019-02-24 DIAGNOSIS — H109 Unspecified conjunctivitis: Secondary | ICD-10-CM | POA: Diagnosis not present

## 2019-02-24 MED ORDER — TOBRAMYCIN-DEXAMETHASONE 0.3-0.1 % OP SUSP: 1.0000 [drp] | Freq: Four times a day (QID) | OPHTHALMIC | 0 refills | Status: DC

## 2019-02-24 NOTE — ED Provider Notes (Signed)
[facility]    CSN: [id number] Arrival date & time: 02/24/19  1425      History   Chief Complaint Chief Complaint  Patient presents with  . Nasal Congestion  . Eye Problem    HPI Luis Boyd is a 34 y.o. male.   This is the initial visit (first visit in over 3 years) of this 34 year old man with right eye watery discharge.  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  Symptoms began yesterday after work.  Does not feel like there is anything in the eye and has had no mattering this morning.  Nevertheless his eye continues to water quite profusely.  Patient does not wear contact lenses.     History reviewed. No pertinent past medical history.  Patient Active Problem List   Diagnosis Date Noted  . Strain of lumbar region 08/27/2018  . Cervical strain 08/27/2018  . 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 Labral tear of shoulder, left, subsequent encounter 07/05/2015    History reviewed. No pertinent surgical history.     Home Medications    Prior to Admission medications   Medication Sig Start Date End Date Taking? Authorizing Provider  tobramycin-dexamethasone [facility]) ophthalmic solution Place 1 drop into the right eye every 6 (six) hours. 02/24/19   Elvina Sidle, MD    Family History Family History  Problem Relation Age of Onset  . Hypertension Brother     Social History Social History   Tobacco Use  . Smoking status: Former Smoker    Quit date: 2012    Years since quitting: 9.0  . Smokeless tobacco: Never Used  Substance Use Topics  . Alcohol use: No  . Drug use: No     Allergies   Patient has no known allergies.   Review of Systems Review of Systems   Physical Exam Triage Vital Signs ED Triage Vitals  Enc Vitals Group     BP      Pulse      Resp      Temp      Temp src      SpO2      Weight      Height      Head Circumference      Peak Flow      Pain Score      Pain Loc    Pain Edu?      Excl. in GC?    No data found.  Updated Vital Signs BP (!) 147/78 (BP Location: Right Arm)   Pulse 70   Temp 98.3 F (36.8 C) (Oral)   Resp 20   Ht 5\' 6"  (1.676 m)   Wt 64.9 kg   SpO2 98%   BMI 23.08 kg/m    Physical Exam Vitals and nursing note reviewed.  Constitutional:      Appearance: Normal appearance. He is normal weight.  Eyes:     Comments: Examination of the right eye reveals mild injection.  Patient has quite a bit of blepharospasm.  Eversion of the upper lid reveals no foreign body.  Fluorescein staining reveals no scratch on the cornea.  Eye was flushed with saline  Pulmonary:     Effort: Pulmonary effort is normal.  Musculoskeletal:        General: Normal range of motion.     Cervical back: Normal range of motion and neck supple.  Skin:    General: Skin is warm and dry.  Neurological:  General: No focal deficit present.     Mental Status: He is alert.  Psychiatric:        Mood and Affect: Mood normal.        Behavior: Behavior normal.        Thought Content: Thought content normal.        Judgment: Judgment normal.      UC Treatments / Results  Labs (all labs ordered are listed, but only abnormal results are displayed) Labs Reviewed  NOVEL CORONAVIRUS, NAA    EKG   Radiology No results found.  Procedures Procedures (including critical care time)  Medications Ordered in UC Medications - No data to display  Initial Impression / Assessment and Plan / UC Course  I have reviewed the triage vital signs and the nursing notes.  Pertinent labs & imaging results that were available during my care of the patient were reviewed by me and considered in my medical decision making (see chart for details).    Final Clinical Impressions(s) / UC Diagnoses   Final diagnoses:  Runny nose  Conjunctivitis of right eye, unspecified conjunctivitis type     Discharge Instructions     Return tomorrow if symptoms are no  better.    ED Prescriptions    Medication Sig Dispense Auth. Provider   tobramycin-dexamethasone [facility]) ophthalmic solution Place 1 drop into the right eye every 6 (six) hours. 5 mL Robyn Haber, MD     I have reviewed the PDMP during this encounter.   Robyn Haber, MD 02/24/19 1501

## 2019-02-24 NOTE — Discharge Instructions (Addendum)
Return tomorrow if symptoms are no better.

## 2019-02-24 NOTE — ED Triage Notes (Signed)
Pt states that yesterday had a runny nose on the right nostril.  The right eye is now watery.

## 2019-02-25 LAB — NOVEL CORONAVIRUS, NAA: SARS-CoV-2, NAA: NOT DETECTED

## 2019-11-26 ENCOUNTER — Encounter: Payer: Self-pay | Admitting: Sports Medicine

## 2019-11-26 ENCOUNTER — Ambulatory Visit (INDEPENDENT_AMBULATORY_CARE_PROVIDER_SITE_OTHER): Payer: 59 | Admitting: Sports Medicine

## 2019-11-26 DIAGNOSIS — S39012D Strain of muscle, fascia and tendon of lower back, subsequent encounter: Secondary | ICD-10-CM | POA: Diagnosis not present

## 2019-11-26 DIAGNOSIS — N489 Disorder of penis, unspecified: Secondary | ICD-10-CM | POA: Diagnosis not present

## 2019-11-26 MED ORDER — CLOTRIMAZOLE-BETAMETHASONE 1-0.05 % EX CREA: 1.0000 "application " | TOPICAL_CREAM | Freq: Two times a day (BID) | CUTANEOUS | 0 refills | Status: DC

## 2019-11-26 NOTE — Assessment & Plan Note (Signed)
This is a pleasant 34 year old male, 1 week ago he told me he sat on the toilet and his penis touched seat. He denies any new sexual partners, no drainage, no dysuria, no constitutional symptoms. Since then he has had a rash on the right side of his glans, on exam there appear to be a few grouped crusting lesions. I am not entirely sure what this is, it does not obviously look like a herpetic outbreak. Either way this is mild balanitis, adding Lotrisone cream, full STD screens. Return in 2 weeks to make sure this is gone.

## 2019-11-26 NOTE — Assessment & Plan Note (Signed)
Luis Boyd also has some back pain, he really just needs a note telling his boss that he can only use the "Shirlee [place]" machine for 3 hours at a time every other day.

## 2019-11-26 NOTE — Progress Notes (Signed)
    Procedures performed today:    None.  Independent interpretation of notes and tests performed by another provider:   None.  Brief History, Exam, Impression, and Recommendations:    Penile lesion This is a pleasant 34 year old male, 1 week ago he told me he sat on the toilet and his penis touched seat. He denies any new sexual partners, no drainage, no dysuria, no constitutional symptoms. Since then he has had a rash on the right side of his glans, on exam there appear to be a few grouped crusting lesions. I am not entirely sure what this is, it does not obviously look like a herpetic outbreak. Either way this is mild balanitis, adding Lotrisone cream, full STD screens. Return in 2 weeks to make sure this is gone.  Strain of lumbar region Renn also has some back pain, he really just needs a note telling his boss that he can only use the "[place]" machine for 3 hours at a time every other day.    ___________________________________________ Ihor Austin. Benjamin Stain, M.D., ABFM., CAQSM. Primary Care and Sports Medicine [place] [facility]  Adjunct Instructor of Family Medicine  [employer]

## 2019-12-02 LAB — HEPATITIS PANEL, ACUTE
Hep A IgM: NONREACTIVE
Hep B C IgM: NONREACTIVE
Hepatitis B Surface Ag: NONREACTIVE
Hepatitis C Ab: NONREACTIVE
SIGNAL TO CUT-OFF: 0.03 (ref <1.00)

## 2019-12-02 LAB — HSV 1/2 AB (IGM), IFA W/RFLX TITER
HSV 1 IgM Screen: NEGATIVE
HSV 2 IgM Screen: NEGATIVE

## 2019-12-02 LAB — HIV ANTIBODY (ROUTINE TESTING W REFLEX): HIV 1&2 Ab, 4th Generation: NONREACTIVE

## 2019-12-02 LAB — C. TRACHOMATIS/N. GONORRHOEAE RNA
C. trachomatis RNA, TMA: NOT DETECTED
N. gonorrhoeae RNA, TMA: NOT DETECTED

## 2019-12-02 LAB — RPR: RPR Ser Ql: NONREACTIVE

## 2019-12-02 LAB — HSV 2 ANTIBODY, IGG: HSV 2 Glycoprotein G Ab, IgG: <0.9 index

## 2019-12-15 NOTE — Telephone Encounter (Signed)
New note written, see if you can help him with his technical issues on his MyChart, I do not think that part needs me.

## 2019-12-21 ENCOUNTER — Telehealth: Payer: Self-pay

## 2019-12-21 NOTE — Telephone Encounter (Signed)
Luis Boyd left a message requesting some clarification on the work note presented by the patient.    Returned call; left VM stating to return call and leave specific information needed.

## 2019-12-22 NOTE — Telephone Encounter (Signed)
New letter written with end date to restrictions

## 2019-12-22 NOTE — Telephone Encounter (Signed)
[facility] [employer] department called. They need a length of time for restrictions or a re-evaluation date added to letter.   Please fax to [facility] [phone]

## 2019-12-23 NOTE — Telephone Encounter (Signed)
Faxed

## 2020-01-20 NOTE — Telephone Encounter (Signed)
Letter written, okay to fax to his work.

## 2020-04-14 ENCOUNTER — Ambulatory Visit: Payer: 59 | Admitting: Sports Medicine

## 2020-04-14 ENCOUNTER — Other Ambulatory Visit: Payer: Self-pay

## 2020-04-14 DIAGNOSIS — M7521 Bicipital tendinitis, right shoulder: Secondary | ICD-10-CM

## 2020-04-14 DIAGNOSIS — G8929 Other chronic pain: Secondary | ICD-10-CM | POA: Insufficient documentation

## 2020-04-14 DIAGNOSIS — M25521 Pain in right elbow: Secondary | ICD-10-CM | POA: Insufficient documentation

## 2020-04-14 MED ORDER — MELOXICAM 15 MG PO TABS: ORAL_TABLET | ORAL | 3 refills | Status: DC

## 2020-04-14 NOTE — Assessment & Plan Note (Signed)
Right-sided distal biceps tendinitis, no pain over the lateral epicondyle, reproduction of pain with resisted supination, adding meloxicam, he will hold off on upper body working out for the next 3 weeks, focus on cardio and lower body. Homework given, return to see me in a month, injection if no better.

## 2020-04-14 NOTE — Progress Notes (Signed)
    Procedures performed today:    None.  Independent interpretation of notes and tests performed by another provider:   None.  Brief History, Exam, Impression, and Recommendations:    Biceps tendinitis, right, distal Right-sided distal biceps tendinitis, no pain over the lateral epicondyle, reproduction of pain with resisted supination, adding meloxicam, he will hold off on upper body working out for the next 3 weeks, focus on cardio and lower body. Homework given, return to see me in a month, injection if no better.    ___________________________________________ Ihor Austin. Benjamin Stain, M.D., ABFM., CAQSM. Primary Care and Sports Medicine [place] [facility]  Adjunct Instructor of Family Medicine  [employer]

## 2020-05-12 ENCOUNTER — Ambulatory Visit: Payer: 59 | Admitting: Sports Medicine

## 2020-05-12 ENCOUNTER — Ambulatory Visit (INDEPENDENT_AMBULATORY_CARE_PROVIDER_SITE_OTHER): Payer: 59

## 2020-05-12 ENCOUNTER — Other Ambulatory Visit: Payer: Self-pay

## 2020-05-12 DIAGNOSIS — M7521 Bicipital tendinitis, right shoulder: Secondary | ICD-10-CM

## 2020-05-12 NOTE — Assessment & Plan Note (Signed)
Luis Boyd returns, he is a pleasant 35 year old male with distal biceps tendinitis, we asked him to hold off working out for the next 3 weeks, he focused on cardio, NSAIDs added, rehabilitation exercises given, he returns today no better. Today we injected his distal biceps from a dorsal approach, return to see me in a month. MRI if no better

## 2020-05-12 NOTE — Progress Notes (Signed)
    Procedures performed today:    Procedure: Real-time Ultrasound Guided injection of the right distal biceps insertion Device: Samsung HS60  Verbal informed consent obtained.  Time-out conducted.  Noted no overlying erythema, induration, or other signs of local infection.  Skin prepped in a sterile fashion.  Local anesthesia: Topical Ethyl chloride.  With sterile technique and under real time ultrasound guidance:  From a dorsal approach we did note some edema in the distal biceps tendon, I advanced a 25-gauge needle to the distal biceps insertion at the radial tuberosity injected 1 cc kenalog 40, 1 cc lidocaine, 1 cc bupivacaine.   Completed without difficulty  Advised to call if fevers/chills, erythema, induration, drainage, or persistent bleeding.  Images permanently stored and available for review in PACS.  Impression: Technically successful ultrasound guided injection.  Independent interpretation of notes and tests performed by another provider:   None.  Brief History, Exam, Impression, and Recommendations:    Biceps tendinitis, right, distal Luis Boyd, he is a pleasant 35 year old male with distal biceps tendinitis, we asked him to hold off working out for the next 3 weeks, he focused on cardio, NSAIDs added, rehabilitation exercises given, he Boyd today no better. Today we injected his distal biceps from a dorsal approach, return to see me in a month. MRI if no better    ___________________________________________ Luis Boyd. Luis Boyd, M.D., ABFM., CAQSM. Primary Care and Sports Medicine [place] [facility]  Adjunct Instructor of Family Medicine  [employer]

## 2020-06-09 ENCOUNTER — Ambulatory Visit: Payer: 59 | Admitting: Sports Medicine

## 2020-06-16 ENCOUNTER — Ambulatory Visit: Payer: 59 | Admitting: Sports Medicine

## 2020-06-30 ENCOUNTER — Ambulatory Visit: Payer: 59 | Admitting: Sports Medicine

## 2020-07-07 ENCOUNTER — Ambulatory Visit: Payer: 59 | Admitting: Sports Medicine

## 2020-07-21 ENCOUNTER — Ambulatory Visit: Payer: 59 | Admitting: Sports Medicine

## 2020-07-21 ENCOUNTER — Ambulatory Visit (INDEPENDENT_AMBULATORY_CARE_PROVIDER_SITE_OTHER): Payer: 59

## 2020-07-21 ENCOUNTER — Encounter: Payer: Self-pay | Admitting: Sports Medicine

## 2020-07-21 ENCOUNTER — Other Ambulatory Visit: Payer: Self-pay

## 2020-07-21 DIAGNOSIS — M7521 Bicipital tendinitis, right shoulder: Secondary | ICD-10-CM

## 2020-07-21 IMAGING — DX DG ELBOW COMPLETE 3+V*R*
4 series · 4 of 4 positions shown · non-contrast
Comparison: None.

CLINICAL DATA: Pain

EXAM:
RIGHT ELBOW - COMPLETE 3+ VIEW

[elbow ap]
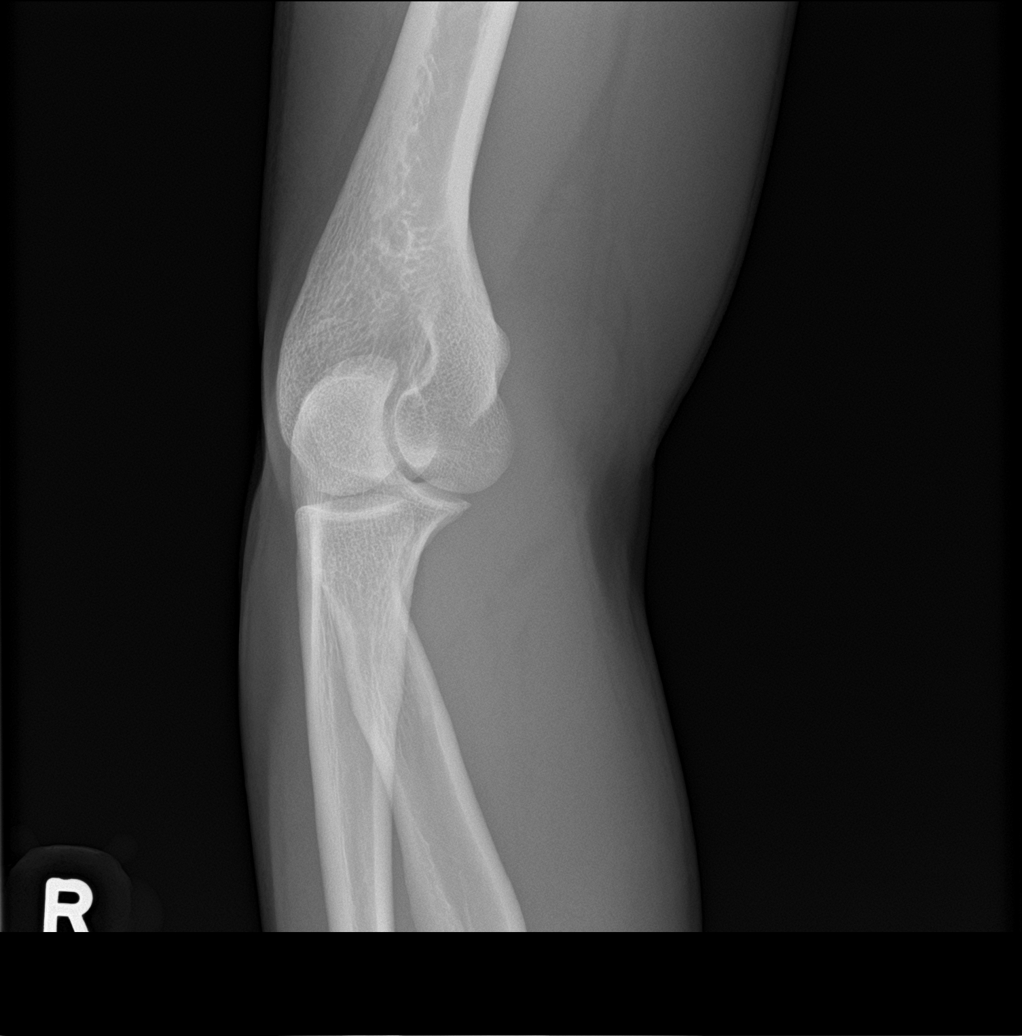

[elbow obl (1 of 2)]
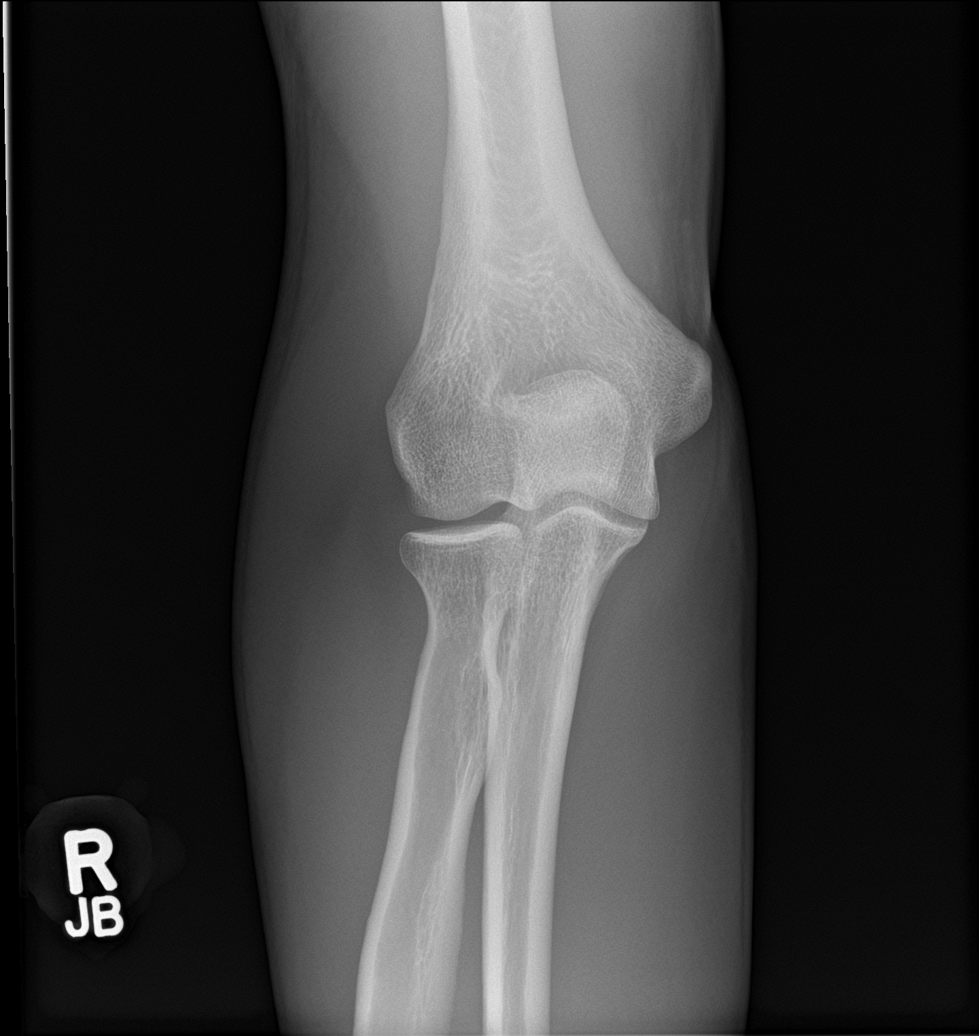

[elbow obl (2 of 2)]
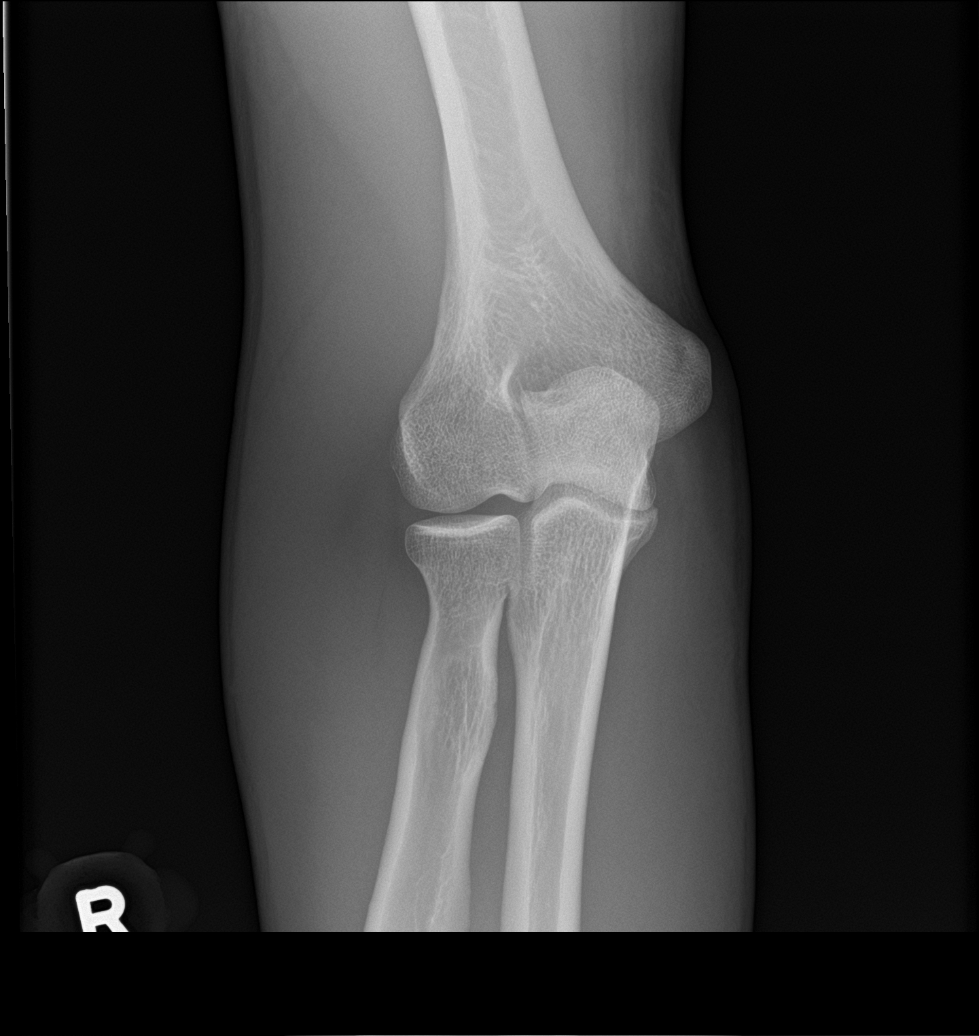

[elbow lat]
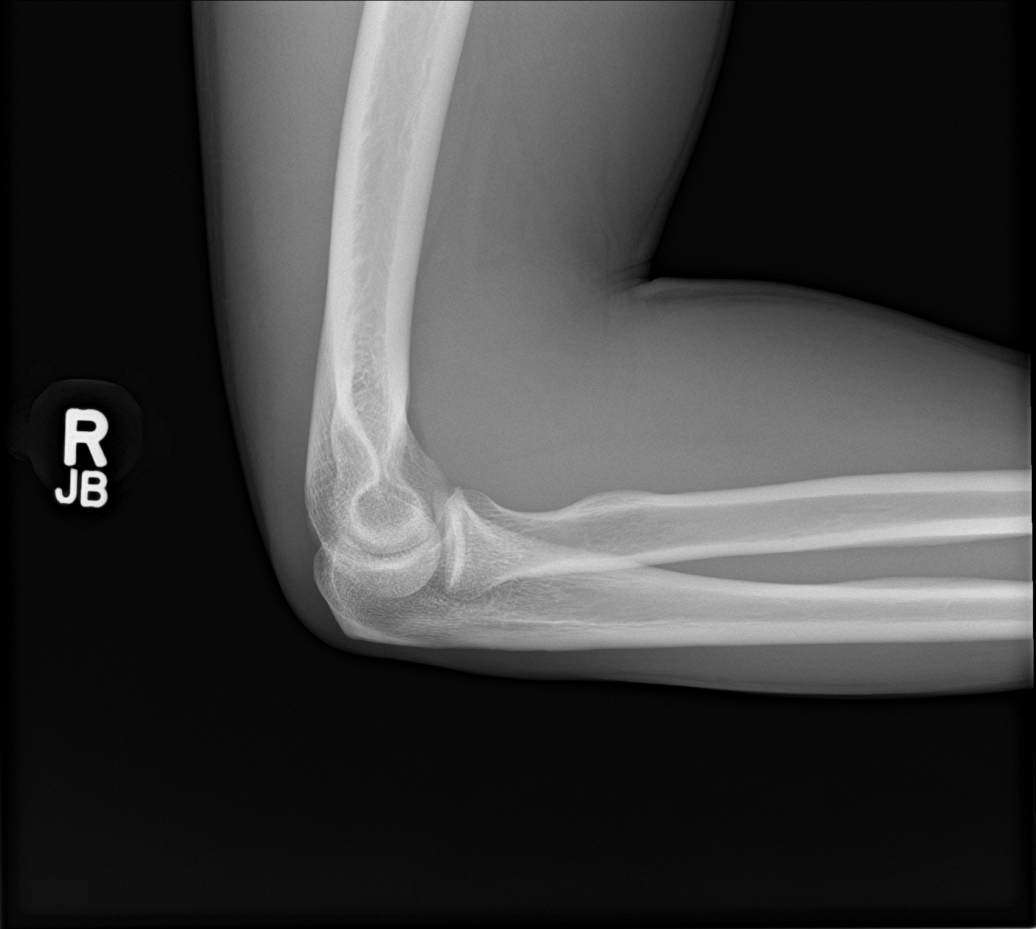

[4 of 4 positions shown; findings below may reference images not displayed]

FINDINGS: Frontal, lateral, and bilateral oblique views were obtained. No
fracture or dislocation. No joint effusion. Joint spaces appear
normal. No erosive changes.
IMPRESSION: No fracture or dislocation.  No appreciable arthropathy.

## 2020-07-21 NOTE — Progress Notes (Signed)
    Procedures performed today:    None.  Independent interpretation of notes and tests performed by another provider:   None.  Brief History, Exam, Impression, and Recommendations:    Biceps tendinitis, right, distal Luis Boyd returns, he is a pleasant 35 year old male, we diagnosed him with distal biceps tendinitis back in April, he held off of working out for for 3 weeks, unfortunately continued to have pain so at the last visit in April we injected his distal biceps from a dorsal approach, he had temporary relief, he really does not have much pain with resisted supination anymore, he does continue however to have pain that he localizes on the lateral aspect of his elbow, occasionally happens at work, worse with resisted pronation. I cannot really identify his pain generator on exam today so we will go ahead and proceed with x-rays and an MRI.  Return to see me to go over MRI results.    ___________________________________________ Luis Boyd. Luis Boyd, M.D., ABFM., CAQSM. Primary Care and Sports Medicine [place] [facility]  Adjunct Instructor of Family Medicine  [employer]

## 2020-07-21 NOTE — Assessment & Plan Note (Signed)
Luis Boyd returns, he is a pleasant 35 year old male, we diagnosed him with distal biceps tendinitis back in April, he held off of working out for for 3 weeks, unfortunately continued to have pain so at the last visit in April we injected his distal biceps from a dorsal approach, he had temporary relief, he really does not have much pain with resisted supination anymore, he does continue however to have pain that he localizes on the lateral aspect of his elbow, occasionally happens at work, worse with resisted pronation. I cannot really identify his pain generator on exam today so we will go ahead and proceed with x-rays and an MRI.  Return to see me to go over MRI results.

## 2020-07-30 ENCOUNTER — Ambulatory Visit (INDEPENDENT_AMBULATORY_CARE_PROVIDER_SITE_OTHER): Payer: 59

## 2020-07-30 ENCOUNTER — Other Ambulatory Visit: Payer: Self-pay

## 2020-07-30 DIAGNOSIS — M7521 Bicipital tendinitis, right shoulder: Secondary | ICD-10-CM | POA: Diagnosis not present

## 2020-07-30 IMAGING — MR MR ELBOW*R* W/O CM
5 series · 40 of 40 positions shown · non-contrast
Comparison: Radiograph [DATE]

CLINICAL DATA: Elbow pain, chronic, biceps tendon tear suspected,
nondiagnostic xray Persistent lateral elbow pain, failed distal
biceps injection

EXAM:
MRI OF THE RIGHT ELBOW WITHOUT CONTRAST
TECHNIQUE: Multiplanar, multisequence MR imaging of the elbow was performed. No
intravenous contrast was administered.

[Series 3: T1 · axial · 4.0mm · 0.44mm/px · z∈[-89,+58]mm · 10 of 35 slices shown (1 of 2)]
[im 1/35]
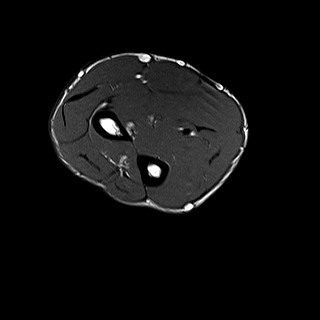
[im 4/35]
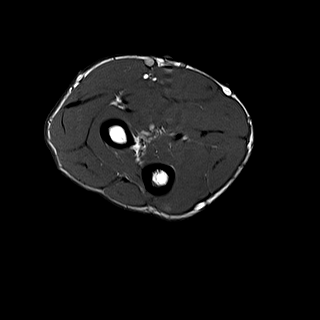
[im 8/35]
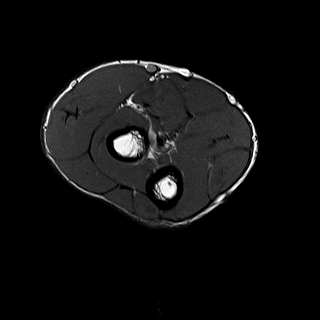
[im 12/35]
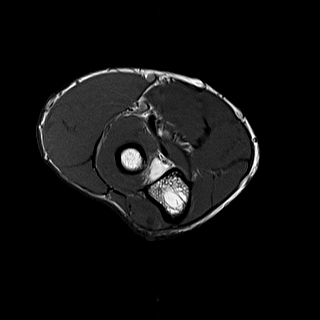
[im 16/35]
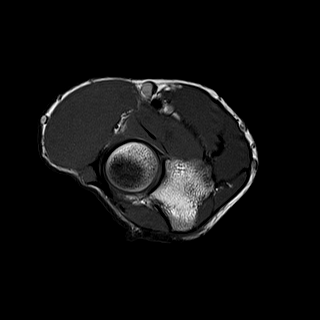
[im 19/35]
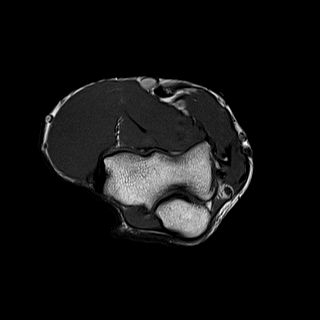
[im 23/35]
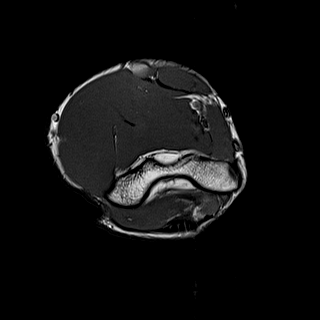
[im 27/35]
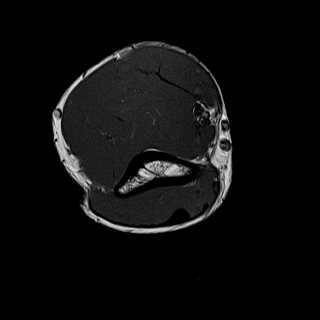
[im 31/35]
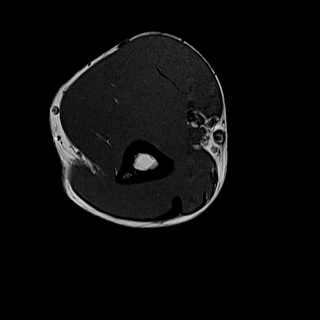
[im 35/35]
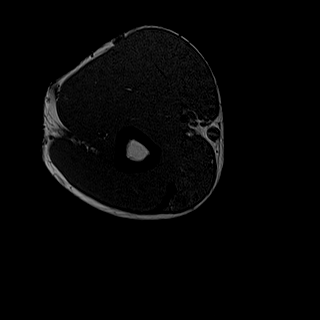

[Series 4: T2 fat-sat · axial · 4.0mm · 0.55mm/px · z∈[-89,+58]mm · 10 of 35 slices shown (1 of 3)]
[im 1/35]
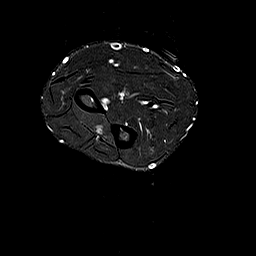
[im 4/35]
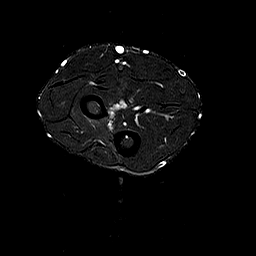
[im 8/35]
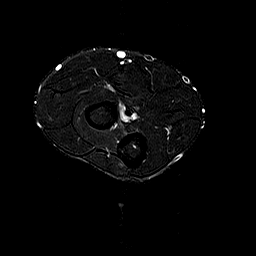
[im 12/35]
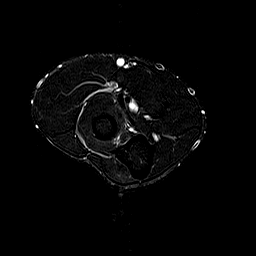
[im 16/35]
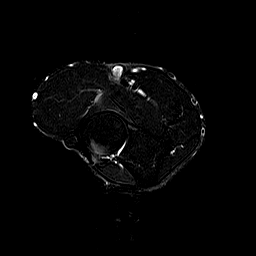
[im 19/35]
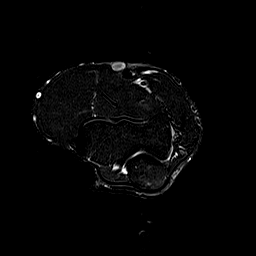
[im 23/35]
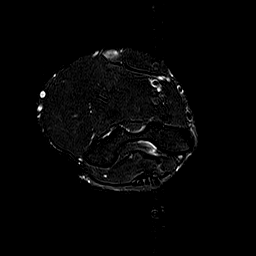
[im 27/35]
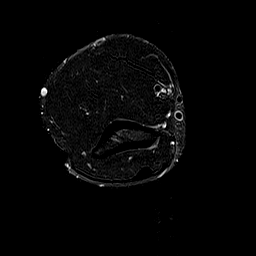
[im 31/35]
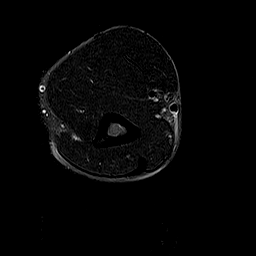
[im 35/35]
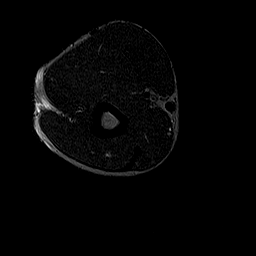

[Series 5: T1 · coronal · 4.0mm · 0.50mm/px · 6 of 24 slices shown (2 of 2)]
[im 1/24]
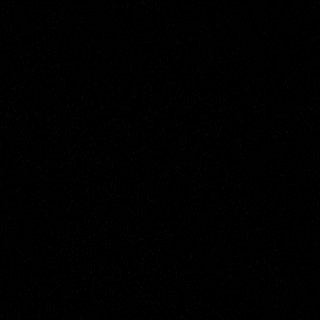
[im 5/24]
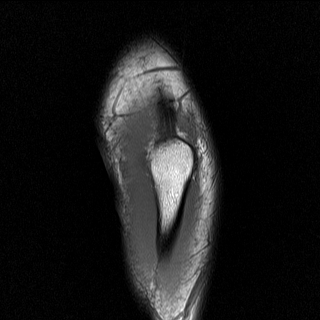
[im 10/24]
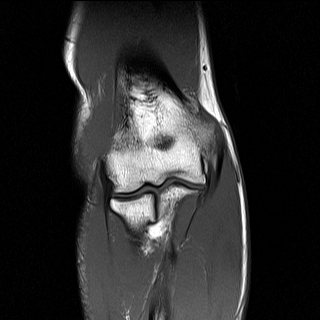
[im 14/24]
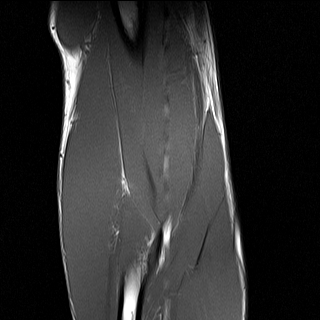
[im 19/24]
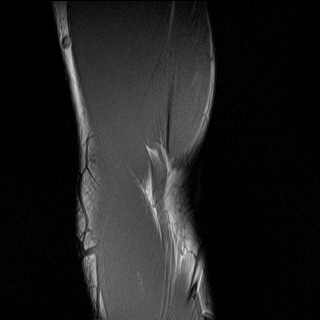
[im 24/24]
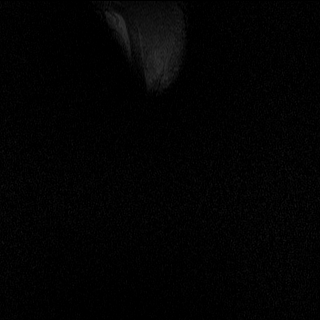

[Series 6: T2 fat-sat · coronal · 4.0mm · 0.55mm/px · 6 of 24 slices shown (2 of 3)]
[im 1/24]
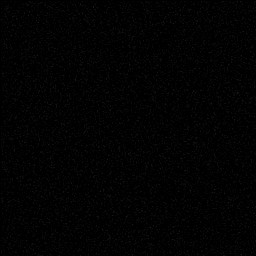
[im 5/24]
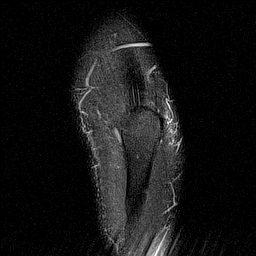
[im 10/24]
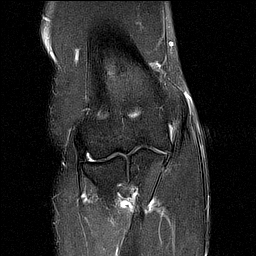
[im 14/24]
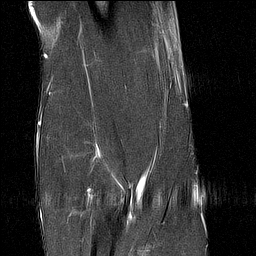
[im 19/24]
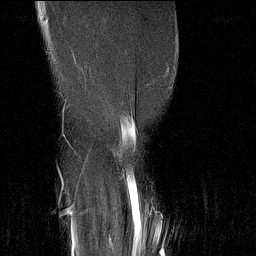
[im 24/24]
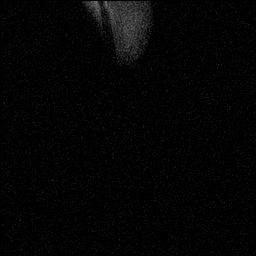

[Series 7: T2 fat-sat · sagittal · 3.0mm · 0.55mm/px · 8 of 31 slices shown (3 of 3)]
[im 1/31]
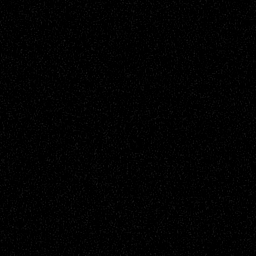
[im 5/31]
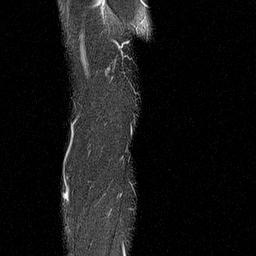
[im 9/31]
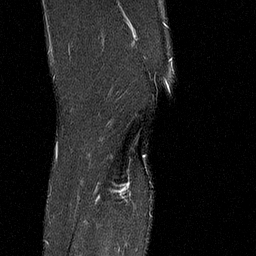
[im 13/31]
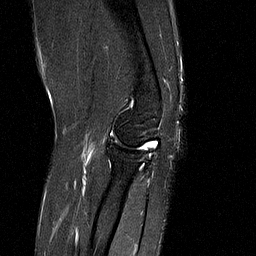
[im 18/31]
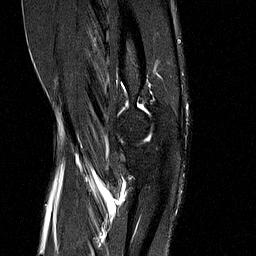
[im 22/31]
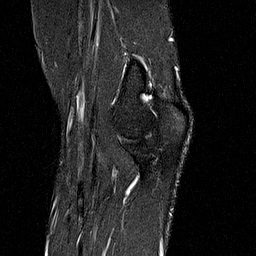
[im 26/31]
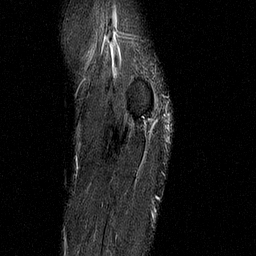
[im 31/31]
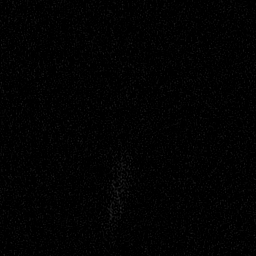

[40 of 40 positions shown; findings below may reference images not displayed]

FINDINGS: TENDONS

Common forearm flexor origin: Intact

Common forearm extensor origin: Intact

Biceps: Intact

Triceps: Intact

LIGAMENTS

Medial stabilizers: Intact

Lateral stabilizers:  Intact

Cartilage: No chondral defect.

Joint: No significant joint effusion.

Cubital tunnel: Mild enlargement and increased signal of the ulnar
nerve within the cubital tunnel.

Bones: No fracture or dislocation. No marrow abnormality.

Soft Tissues: Muscles are normal. No fluid collection or hematoma.
IMPRESSION: Intact distal biceps tendon. No acute findings to explain lateral
elbow pain.

Mild enlargement and increased signal of the ulnar nerve within the
cubital tunnel, correlate for any symptoms in the ulnar nerve
distribution.

## 2020-08-03 ENCOUNTER — Ambulatory Visit: Payer: 59 | Admitting: Sports Medicine

## 2020-08-03 ENCOUNTER — Other Ambulatory Visit: Payer: Self-pay

## 2020-08-03 ENCOUNTER — Ambulatory Visit (INDEPENDENT_AMBULATORY_CARE_PROVIDER_SITE_OTHER): Payer: 59

## 2020-08-03 DIAGNOSIS — G8929 Other chronic pain: Secondary | ICD-10-CM

## 2020-08-03 DIAGNOSIS — M25521 Pain in right elbow: Secondary | ICD-10-CM | POA: Diagnosis not present

## 2020-08-03 MED ORDER — GABAPENTIN 300 MG PO CAPS: ORAL_CAPSULE | ORAL | 3 refills | Status: DC

## 2020-08-03 NOTE — Progress Notes (Signed)
    Procedures performed today:    Procedure: Real-time Ultrasound Guided injection of the right elbow joint Device: Samsung HS60  Verbal informed consent obtained.  Time-out conducted.  Noted no overlying erythema, induration, or other signs of local infection.  Skin prepped in a sterile fashion.  Local anesthesia: Topical Ethyl chloride.  With sterile technique and under real time ultrasound guidance: Noted normal-appearing joint, I advanced a 25-gauge needle through the anconeus muscle into the joint and 1 cc Kenalog 40, 1 cc lidocaine, 1 cc bupivacaine injected easily Completed without difficulty  Advised to call if fevers/chills, erythema, induration, drainage, or persistent bleeding.  Images permanently stored and available for review in PACS.  Impression: Technically successful ultrasound guided injection.  Independent interpretation of notes and tests performed by another provider:   None.  Brief History, Exam, Impression, and Recommendations:    Chronic elbow pain, right Luis Boyd returns, he is a pleasant 35 year old male, chronic right elbow pain, diagnosed with distal biceps tendinitis back in April, we took him out of work for 3 weeks and continued to have pain so we injected his distal biceps from a dorsal approach, he had temporary relief, and has better pronation and supination. He still has some discomfort that he localizes at the anconeus muscle and in the elbow joint, we obtained an MRI that was surprisingly normal. There was incidental thickening of the ulnar nerve but he has no medial elbow symptoms and no symptoms of ulnar neuropathy. He does endorse occasional shooting sensations from his shoulder all the way down to his lateral fingers. Today we injected his elbow joint, considering the shooting from the shoulder to the lateral fingers I would like to also add some Neurontin to hold him over in the meantime.    ___________________________________________ Ihor Austin. Benjamin Stain, M.D., ABFM., CAQSM. Primary Care and Sports Medicine  [facility]  Adjunct Instructor of Family Medicine  [employer] [facility] [employer]

## 2020-08-03 NOTE — Assessment & Plan Note (Signed)
Luis Boyd returns, he is a pleasant 35 year old male, chronic right elbow pain, diagnosed with distal biceps tendinitis back in April, we took him out of work for 3 weeks and continued to have pain so we injected his distal biceps from a dorsal approach, he had temporary relief, and has better pronation and supination. He still has some discomfort that he localizes at the anconeus muscle and in the elbow joint, we obtained an MRI that was surprisingly normal. There was incidental thickening of the ulnar nerve but he has no medial elbow symptoms and no symptoms of ulnar neuropathy. He does endorse occasional shooting sensations from his shoulder all the way down to his lateral fingers. Today we injected his elbow joint, considering the shooting from the shoulder to the lateral fingers I would like to also add some Neurontin to hold him over in the meantime.

## 2020-08-08 DIAGNOSIS — M7521 Bicipital tendinitis, right shoulder: Secondary | ICD-10-CM

## 2020-08-08 MED ORDER — MELOXICAM 15 MG PO TABS: 15.0000 mg | ORAL_TABLET | Freq: Every day | ORAL | 3 refills | Status: DC | PRN

## 2020-08-31 ENCOUNTER — Ambulatory Visit (INDEPENDENT_AMBULATORY_CARE_PROVIDER_SITE_OTHER): Payer: 59 | Admitting: Sports Medicine

## 2020-08-31 ENCOUNTER — Other Ambulatory Visit: Payer: Self-pay

## 2020-08-31 ENCOUNTER — Ambulatory Visit (INDEPENDENT_AMBULATORY_CARE_PROVIDER_SITE_OTHER): Payer: 59

## 2020-08-31 DIAGNOSIS — R202 Paresthesia of skin: Secondary | ICD-10-CM

## 2020-08-31 DIAGNOSIS — M25521 Pain in right elbow: Secondary | ICD-10-CM

## 2020-08-31 DIAGNOSIS — R0989 Other specified symptoms and signs involving the circulatory and respiratory systems: Secondary | ICD-10-CM | POA: Insufficient documentation

## 2020-08-31 DIAGNOSIS — G8929 Other chronic pain: Secondary | ICD-10-CM | POA: Diagnosis not present

## 2020-08-31 DIAGNOSIS — R6889 Other general symptoms and signs: Secondary | ICD-10-CM | POA: Diagnosis not present

## 2020-08-31 IMAGING — DX DG CERVICAL SPINE COMPLETE 4+V
6 series · 6 of 6 positions shown · non-contrast
Comparison: None.

CLINICAL DATA: Right arm tingling for several weeks. No injury or
prior surgery.

EXAM:
CERVICAL SPINE - COMPLETE 4+ VIEW

[c-spine lat]
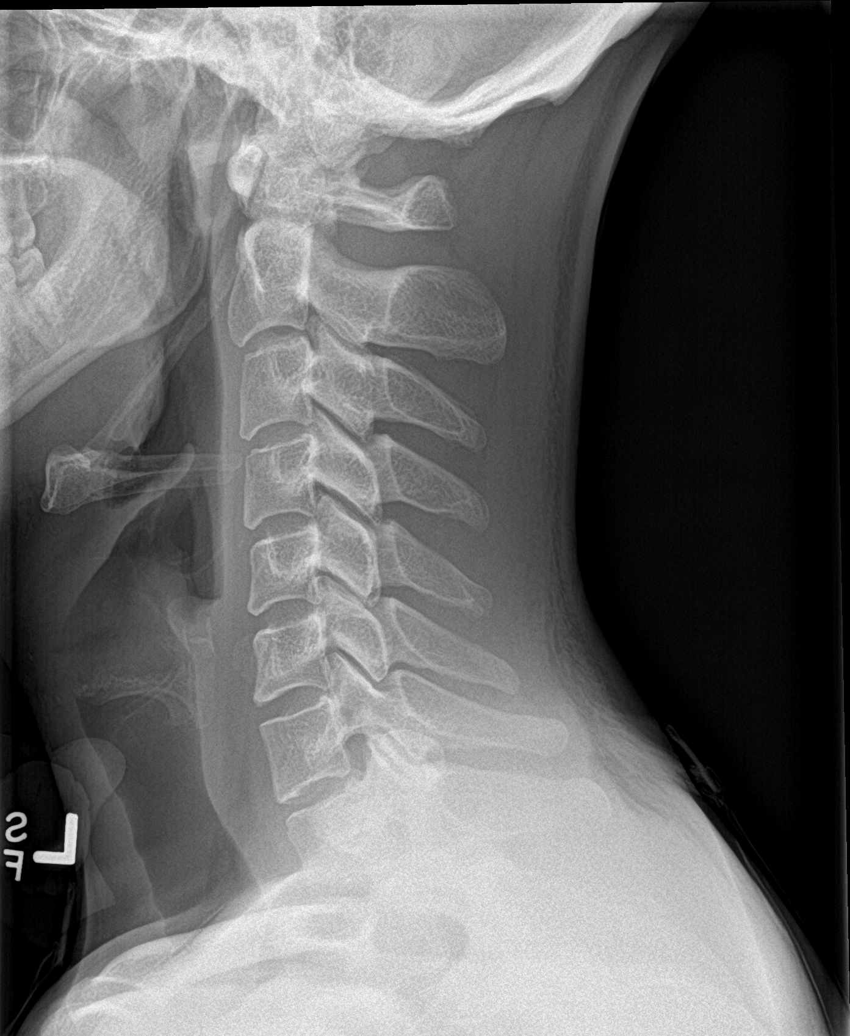

[c-spine obl (1 of 2)]
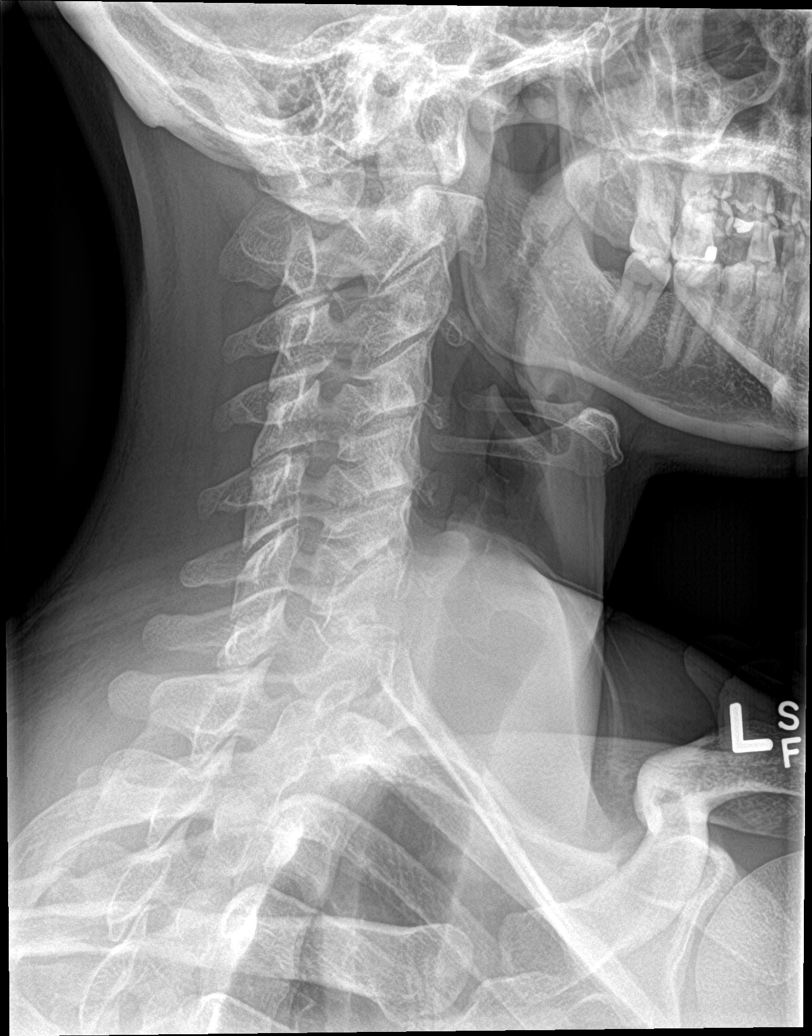

[c-spine obl (2 of 2)]
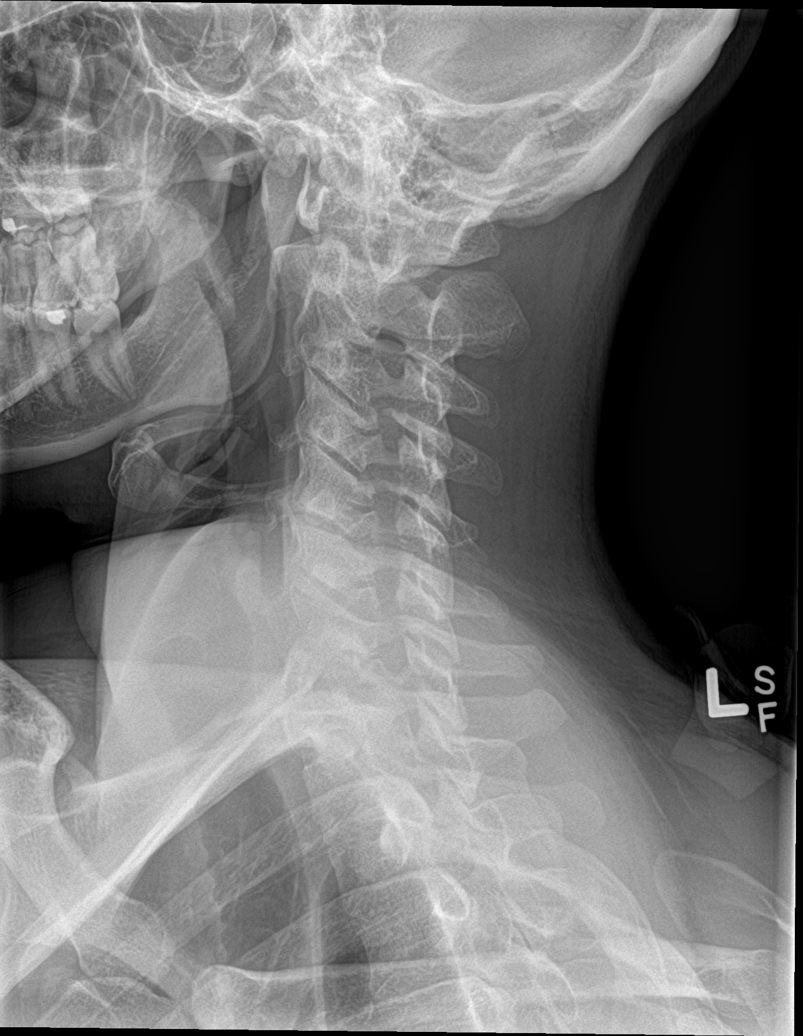

[c-spine ap]
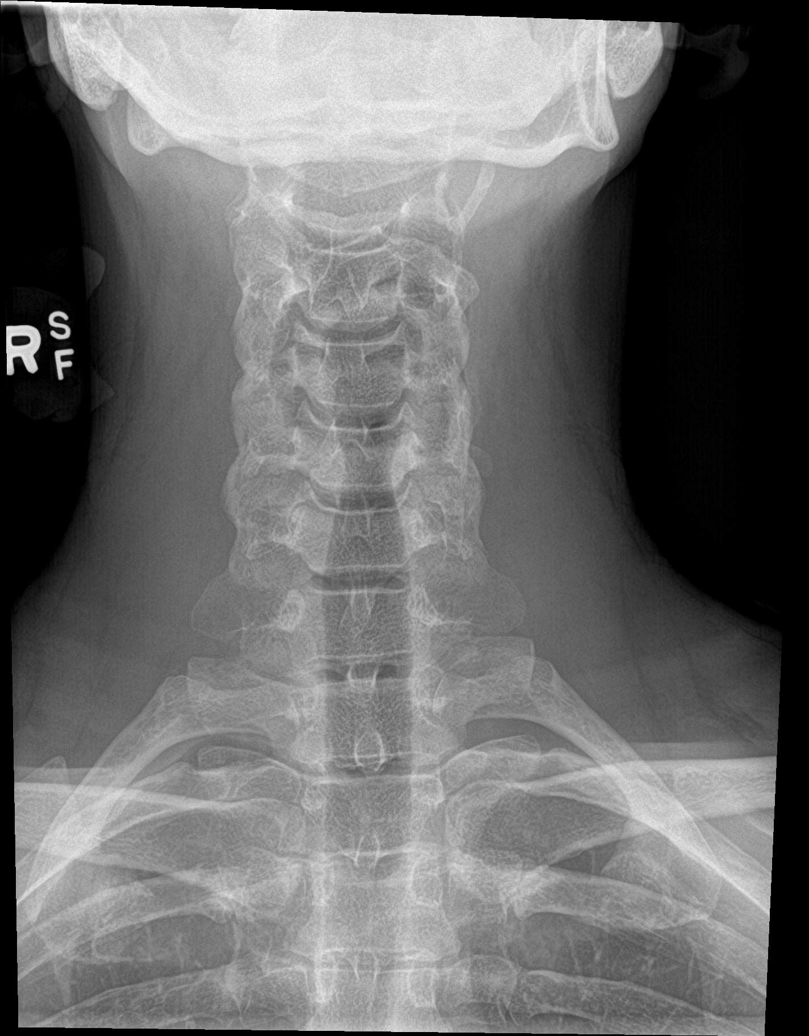

[c-spine open mouth]
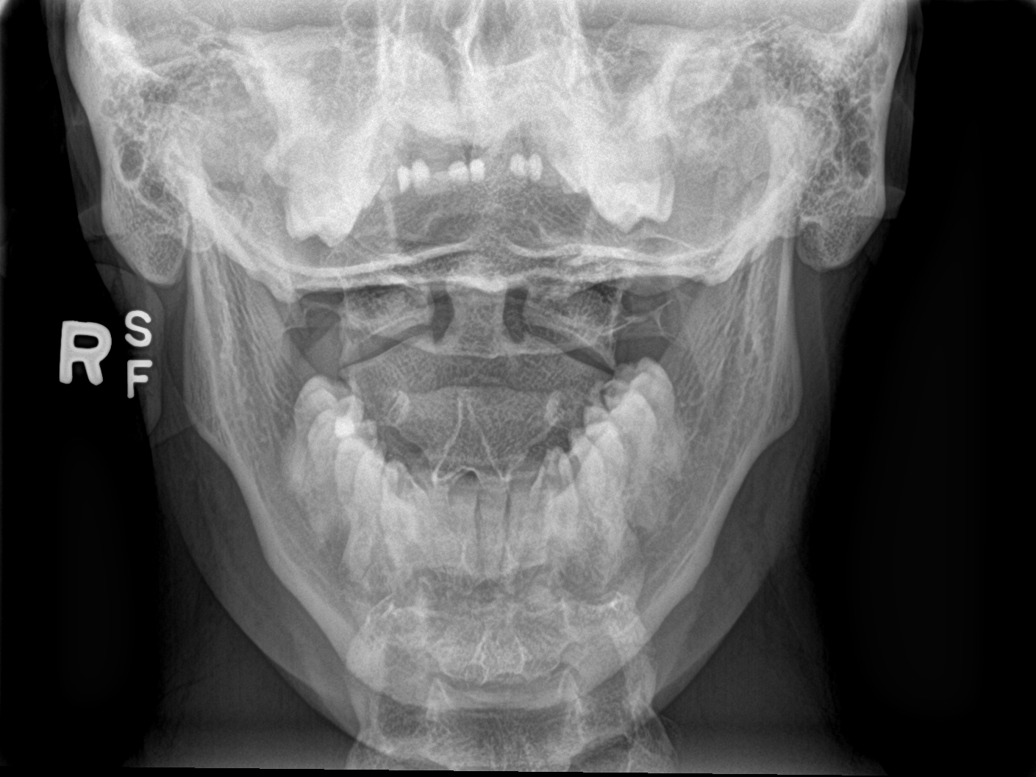

[c-spine swimmers]
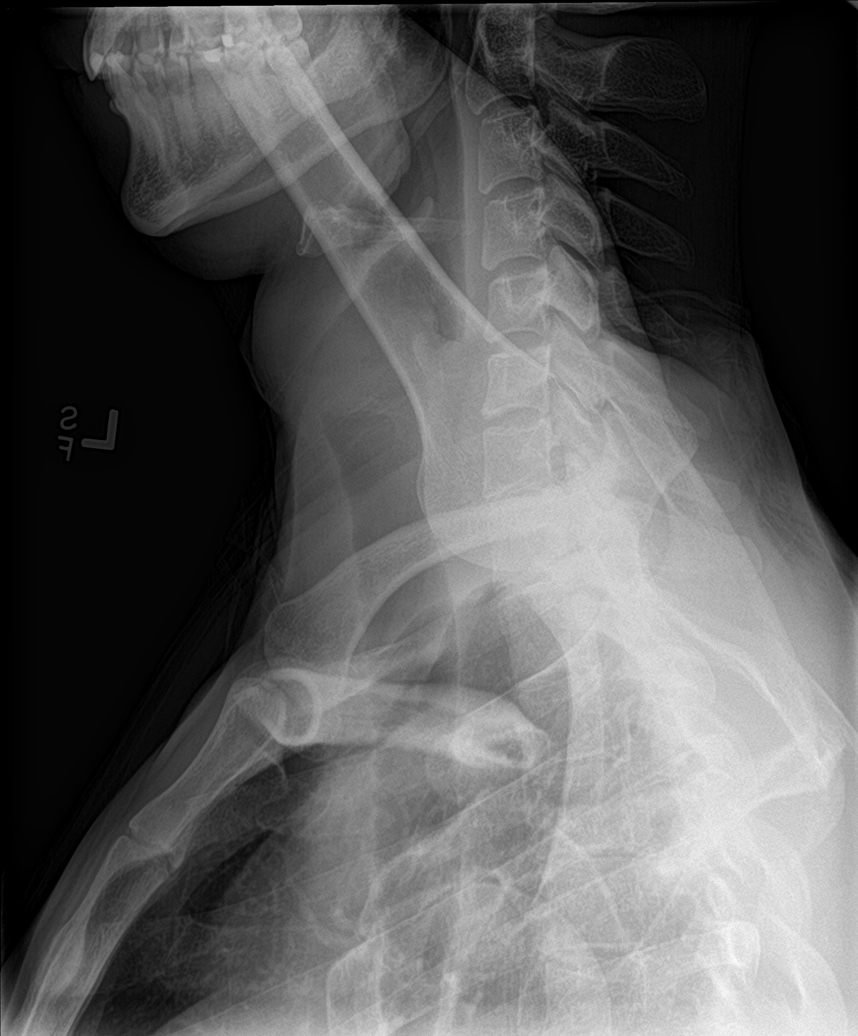

[6 of 6 positions shown; findings below may reference images not displayed]

FINDINGS: The lateral view is diagnostic to the T1 level.

There is no acute fracture or subluxation. Vertebral body heights
are preserved.

Alignment is normal.

Interveterbral disc spaces are maintained.

Normal prevertebral soft tissues.
IMPRESSION: 1. Negative.

## 2020-08-31 MED ORDER — NITROGLYCERIN 0.2 MG/HR TD PT24: MEDICATED_PATCH | TRANSDERMAL | 11 refills | Status: DC

## 2020-08-31 NOTE — Progress Notes (Signed)
    Procedures performed today:    None.  Independent interpretation of notes and tests performed by another provider:   None.  Brief History, Exam, Impression, and Recommendations:    Chronic elbow pain, right Luis Boyd returns, he is a pleasant 35 year old male, chronic right elbow pain initially diagnosed as a distal biceps tendinitis back in April, 2, to work for 3 weeks and gave him restricted duty, he returns with continued pain so we injected the distal biceps from a dorsal approach, only minimal relief. We obtained an MRI that was normal, only with incidental thickening of the ulnar nerve but he at the time did not have any medial elbow or ulnar neuropathy type symptoms. He did endorse occasional shooting sensations from the shoulder down to the lateral fingers, we injected his elbow joint at the last visit. At the last visit also considering the shooting from the shoulder to the lateral fingers we added Neurontin which seemed to help to some degree. He is doing okay but still has some discomfort in his elbow, this raises the suspicion for a cervical degenerative process however stretching his elbow does reproduce symptoms. We are going to x-ray his neck, and add topical nitroglycerin for the elbow. Continue gabapentin, also going to add some neck conditioning exercises, return to see me in a month.  Throat clearing [name] is also noted increased throat clearing after coming off a vacation at [place], he did have some unhealthy eating and potentially drinking, tends to clear his throat through the day, occasionally aspirates when drinking water. 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 He will try to avoid spicy and greasy food, as well as avoid any eating within 2 to 3 hours of bedtime, I do think a medication is needed just yet but if not better in 3 to 4 weeks we can consider a PPI.    ___________________________________________ Luis Boyd. Luis Boyd, M.D.,  ABFM., CAQSM. Primary Care and Sports Medicine [place] [facility]  Adjunct Instructor of Family Medicine  [employer]

## 2020-08-31 NOTE — Assessment & Plan Note (Signed)
Luis Boyd returns, he is a pleasant 35 year old male, chronic right elbow pain initially diagnosed as a distal biceps tendinitis back in April, 2, to work for 3 weeks and gave him restricted duty, he returns with continued pain so we injected the distal biceps from a dorsal approach, only minimal relief. We obtained an MRI that was normal, only with incidental thickening of the ulnar nerve but he at the time did not have any medial elbow or ulnar neuropathy type symptoms. He did endorse occasional shooting sensations from the shoulder down to the lateral fingers, we injected his elbow joint at the last visit. At the last visit also considering the shooting from the shoulder to the lateral fingers we added Neurontin which seemed to help to some degree. He is doing okay but still has some discomfort in his elbow, this raises the suspicion for a cervical degenerative process however stretching his elbow does reproduce symptoms. We are going to x-ray his neck, and add topical nitroglycerin for the elbow. Continue gabapentin, also going to add some neck conditioning exercises, return to see me in a month.

## 2020-08-31 NOTE — Assessment & Plan Note (Signed)
Luis Boyd is also noted increased throat clearing after coming off a vacation at [place], he did have some unhealthy eating and potentially drinking, tends to clear his throat through the day, occasionally aspirates when drinking water. 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 He will try to avoid spicy and greasy food, as well as avoid any eating within 2 to 3 hours of bedtime, I do think a medication is needed just yet but if not better in 3 to 4 weeks we can consider a PPI.

## 2020-10-05 ENCOUNTER — Ambulatory Visit: Payer: 59 | Admitting: Sports Medicine

## 2020-10-05 ENCOUNTER — Other Ambulatory Visit: Payer: Self-pay

## 2020-10-05 DIAGNOSIS — M25521 Pain in right elbow: Secondary | ICD-10-CM

## 2020-10-05 DIAGNOSIS — G8929 Other chronic pain: Secondary | ICD-10-CM

## 2020-10-05 NOTE — Addendum Note (Signed)
Addended by: Donne Anon L on: 10/05/2020 10:10 AM   Modules accepted: Orders

## 2020-10-05 NOTE — Assessment & Plan Note (Signed)
Luis Boyd returns, he is a pleasant 35 year old male, he has had chronic right elbow pain initially diagnosed as a distal biceps tendinitis back in April, we gave him some restricted duty and some conditioning exercises for several weeks and he continued to have pain so we did a distal biceps injection with ultrasound guidance, only minimal relief. We obtained an MRI that was completely normal only with incidental thickening of the ulnar nerve but there were no medial elbow or ulnar neuropathy type symptoms. At that juncture we injected his elbow joint. He only had minimal relief, we added Neurontin which has provided moderate relief. We also tried some topical nitroglycerin without any relief. He is continuing to have elbow pain, he does endorse locking sensations. For this reason we will proceed with MRI of the elbow with intra-articular contrast and thin slices. I would likely get a second opinion from Dr. Everardo Pacific as well at that juncture.

## 2020-10-05 NOTE — Addendum Note (Signed)
Addended by: Monica Becton on: 10/05/2020 10:15 AM   Modules accepted: Orders

## 2020-10-05 NOTE — Progress Notes (Signed)
    Procedures performed today:    None.  Independent interpretation of notes and tests performed by another provider:   None.  Brief History, Exam, Impression, and Recommendations:    Chronic elbow pain, right Luis Boyd returns, he is a pleasant 35 year old male, he has had chronic right elbow pain initially diagnosed as a distal biceps tendinitis back in April, we gave him some restricted duty and some conditioning exercises for several weeks and he continued to have pain so we did a distal biceps injection with ultrasound guidance, only minimal relief. We obtained an MRI that was completely normal only with incidental thickening of the ulnar nerve but there were no medial elbow or ulnar neuropathy type symptoms. At that juncture we injected his elbow joint. He only had minimal relief, we added Neurontin which has provided moderate relief. We also tried some topical nitroglycerin without any relief. He is continuing to have elbow pain, he does endorse locking sensations. For this reason we will proceed with MRI of the elbow with intra-articular contrast and thin slices. I would likely get a second opinion from Dr. Everardo Pacific as well at that juncture.    ___________________________________________ Luis Boyd. Luis Boyd, M.D., ABFM., CAQSM. Primary Care and Sports Medicine [place] [facility]  Adjunct Instructor of Family Medicine  [employer] [facility] [employer] [facility] [employer]

## 2020-10-09 ENCOUNTER — Other Ambulatory Visit: Payer: Self-pay

## 2020-10-09 ENCOUNTER — Ambulatory Visit (INDEPENDENT_AMBULATORY_CARE_PROVIDER_SITE_OTHER): Payer: 59

## 2020-10-09 ENCOUNTER — Ambulatory Visit (INDEPENDENT_AMBULATORY_CARE_PROVIDER_SITE_OTHER): Payer: 59 | Admitting: Sports Medicine

## 2020-10-09 DIAGNOSIS — G8929 Other chronic pain: Secondary | ICD-10-CM

## 2020-10-09 DIAGNOSIS — M25521 Pain in right elbow: Secondary | ICD-10-CM

## 2020-10-09 IMAGING — MR MR ELBOW*R* W/CM
5 of 6 series · 29 of 40 positions shown · IV contrast (agent unspecified)
Comparison: Radiographs and routine MRI [DATE].

CLINICAL DATA: Persistent right elbow pain with locking. Evaluate
for chondral injury or loose body. Limited extension in
weight-bearing for 4 months. No known injury or prior relevant
surgery.

EXAM:
MRI OF THE RIGHT ELBOW WITH CONTRAST (MR Arthrogram)
TECHNIQUE: Multiplanar, multisequence MR imaging of the elbow was performed
immediately following contrast injection into the radiocapitellar
joint under fluoroscopic guidance. No intravenous contrast was
administered.

[Series 5: T1 fat-sat · axial · 3.5mm · 0.43mm/px · z∈[-60,+67]mm · 9 of 32 slices shown (1 of 2)]
[im 1/32]
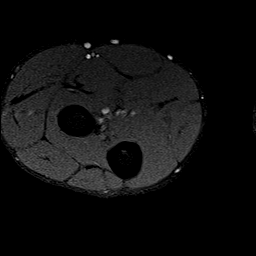
[im 4/32]
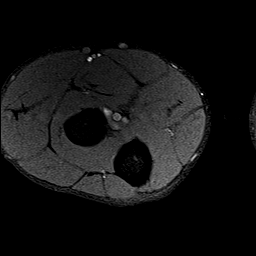
[im 8/32]
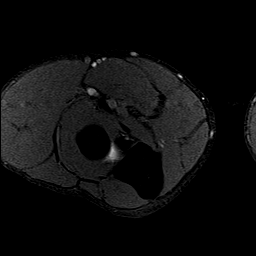
[im 12/32]
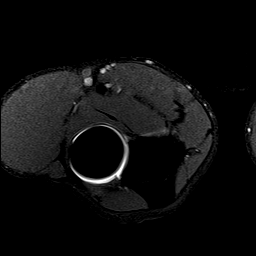
[im 16/32]
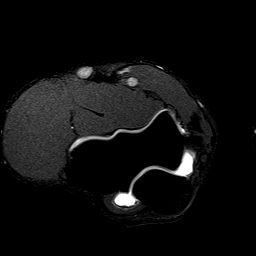
[im 20/32]
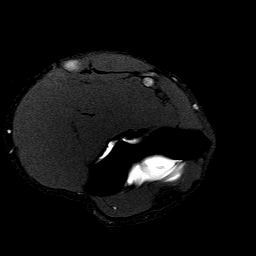
[im 24/32]
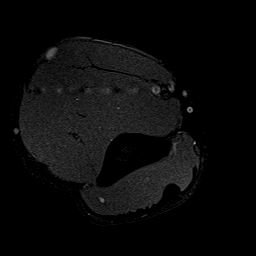
[im 28/32]
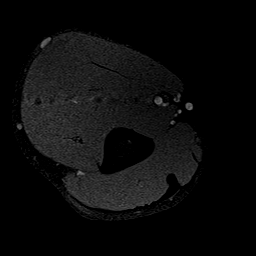
[im 32/32]
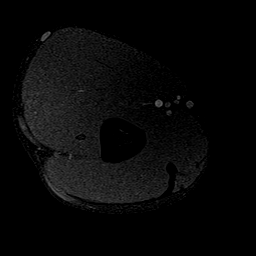

[Series 6: T1 fat-sat · coronal · 3.0mm · 0.27mm/px · 1 of 26 slices shown (2 of 2)]
[im 1/26]
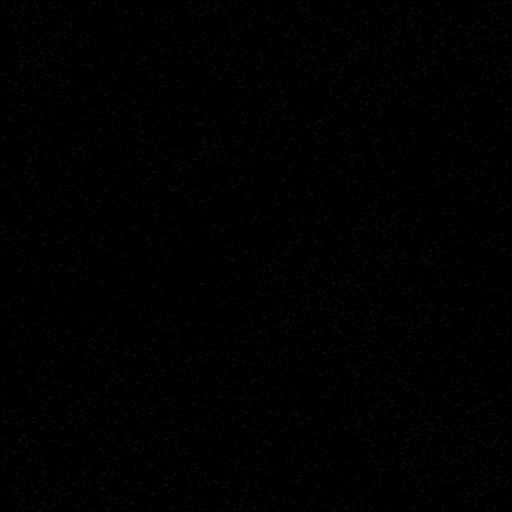

[Series 8: T2 fat-sat · coronal · 3.0mm · 0.55mm/px · 6 of 26 slices shown (1 of 3)]
[im 1/26]
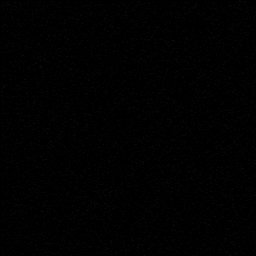
[im 6/26]
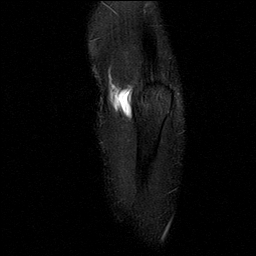
[im 11/26]
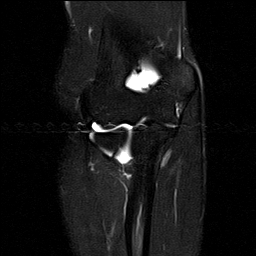
[im 16/26]
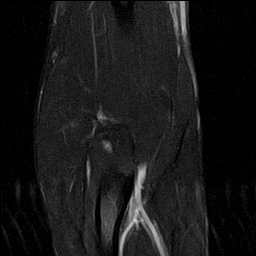
[im 21/26]
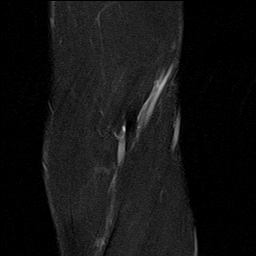
[im 26/26]
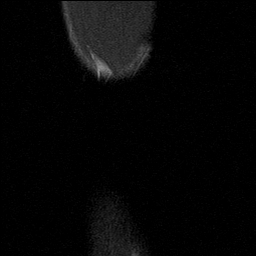

[Series 9: T2 fat-sat · sagittal · 3.0mm · 0.27mm/px · 7 of 28 slices shown (2 of 3)]
[im 1/28]
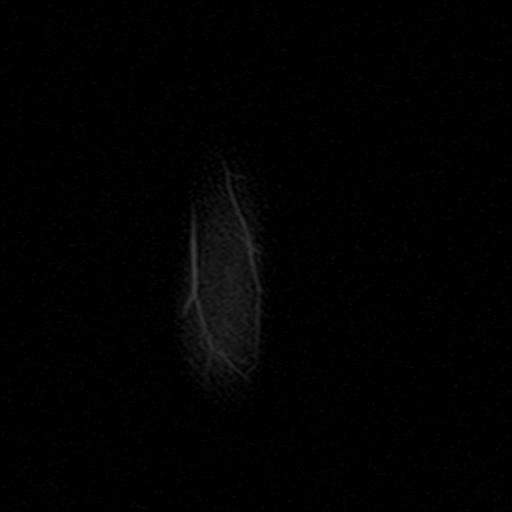
[im 5/28]
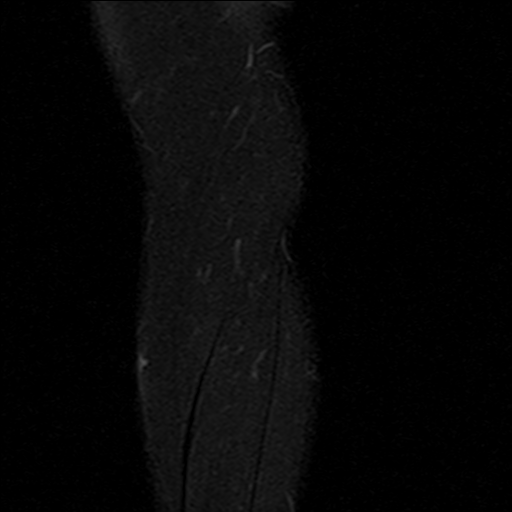
[im 10/28]
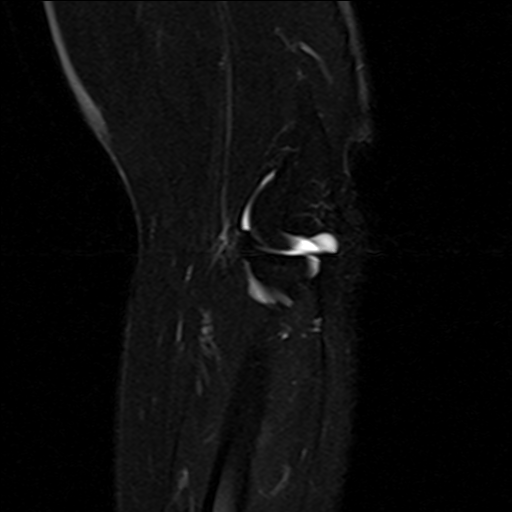
[im 14/28]
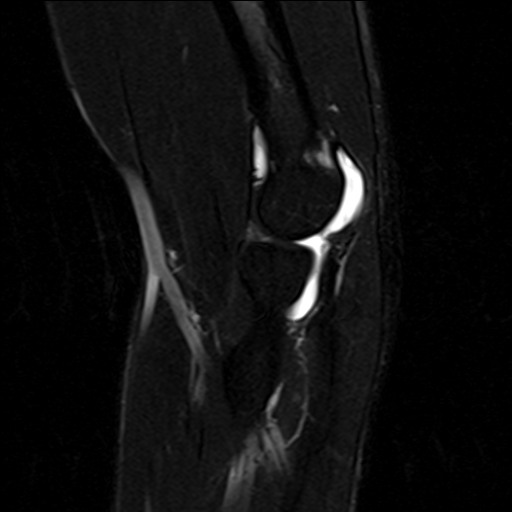
[im 19/28]
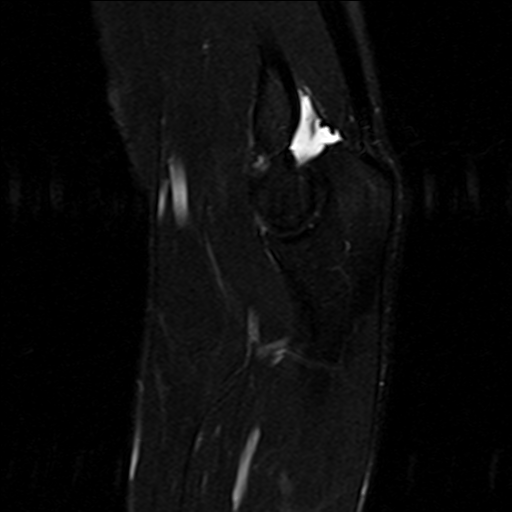
[im 23/28]
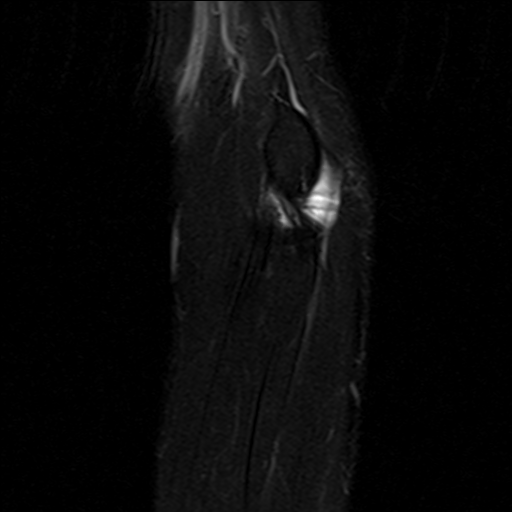
[im 28/28]
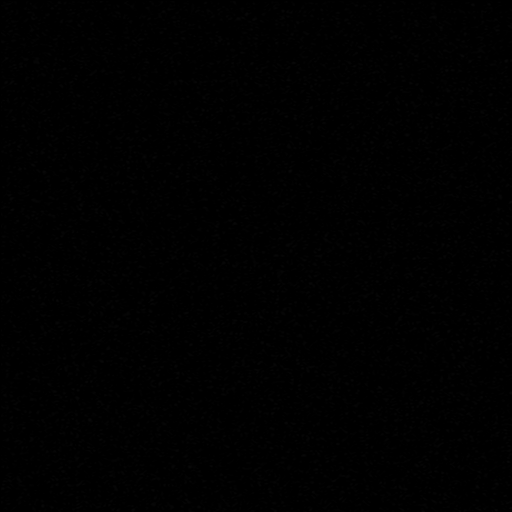

[Series 10: T2 fat-sat · coronal · 3.0mm · 0.27mm/px · 6 of 26 slices shown (3 of 3)]
[im 1/26]
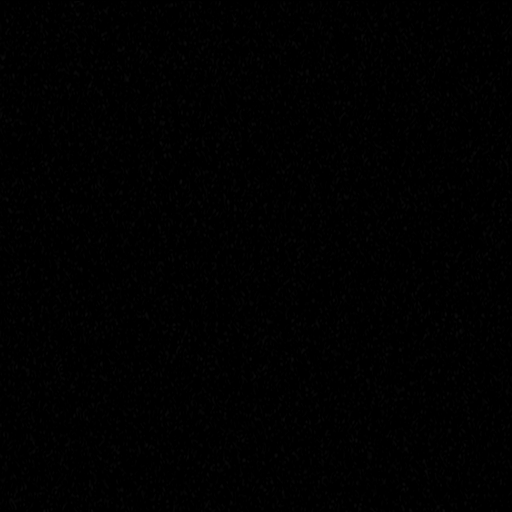
[im 6/26]
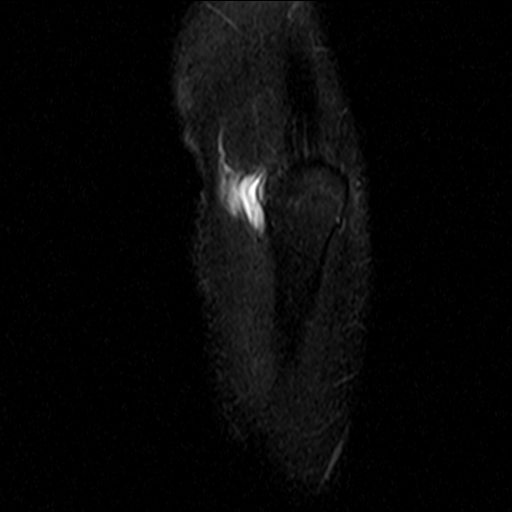
[im 11/26]
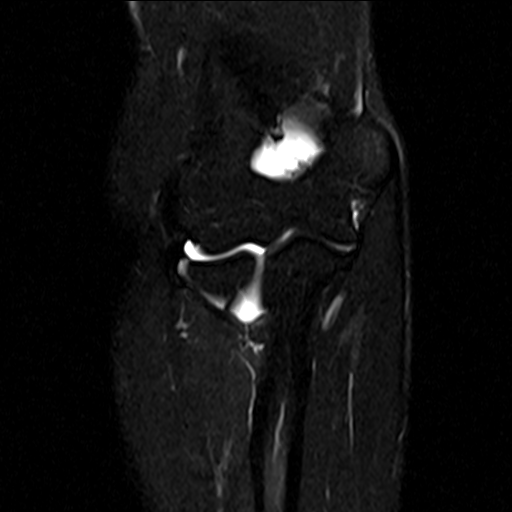
[im 16/26]
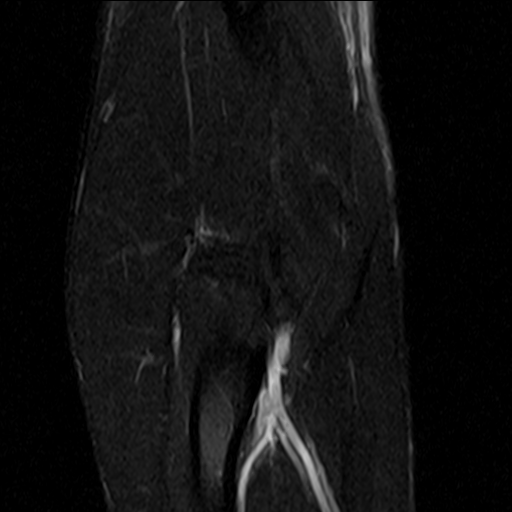
[im 21/26]
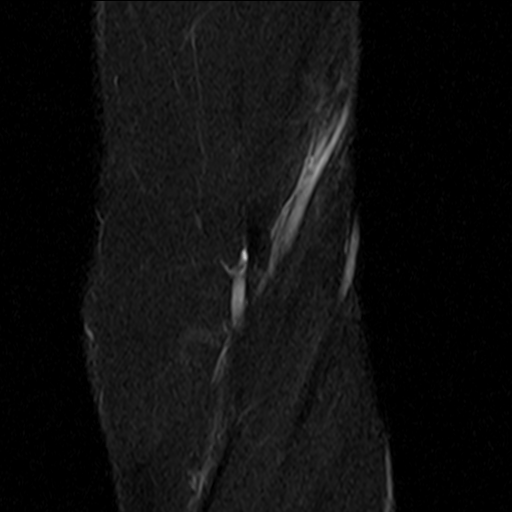
[im 26/26]
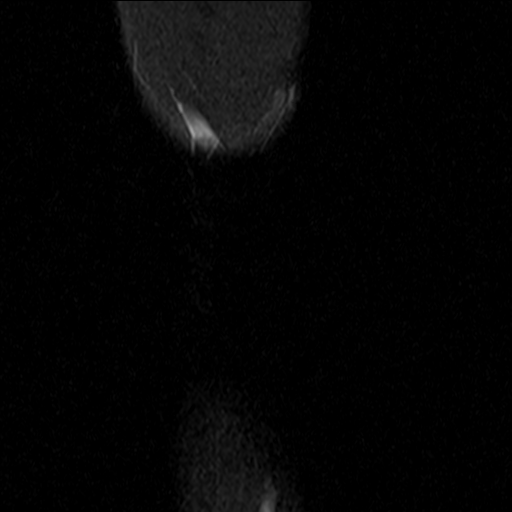

[29 of 40 positions shown; findings below may reference images not displayed]

FINDINGS: TENDONS

Common forearm flexor origin: Intact with normal signal.

Common forearm extensor origin: Intact with normal signal.

Biceps: Intact.

Triceps: Intact with normal signal.

LIGAMENTS

Medial stabilizers: Intact.

Lateral stabilizers: The lateral ulnar and radial collateral
ligaments appear intact.

Cartilage: No evidence of focal chondral defect or subchondral
edema.

Joint: The elbow joint is well distended with contrast. No definite
intra-articular loose bodies are identified. There is a small focus
of susceptibility artifact anterior to the capitellum, best seen on
coronal image [DATE] and sagittal image [DATE]. Based on location, this
is likely a small amount of air related to the injection. No loose
body identified in the coronoid or olecranon fossa.

Cubital tunnel: Stable appearance of the ulnar nerve.

Bones: No acute or significant extra-articular osseous findings.

Other: None.
IMPRESSION: 1. No focal chondral defect or definite loose body identified. Tiny
focus of susceptibility artifact anteriorly is likely a small amount
of air related to the injection.
2. No acute osseous findings.
3. The elbow tendons and ligaments appear intact.

## 2020-10-09 MED ORDER — GADOBUTROL 1 MMOL/ML IV SOLN
1.0000 mL | Freq: Once | INTRAVENOUS | Status: AC | PRN
  Administered 2020-10-09: 1 mL via INTRAVENOUS

## 2020-10-09 NOTE — Assessment & Plan Note (Addendum)
Please see prior notes, injection for elbow arthrography today.  Update: MR arthrography completely normal, adding formal physical therapy.

## 2020-10-09 NOTE — Progress Notes (Addendum)
    Procedures performed today:    Procedure: Real-time Ultrasound Guided gadolinium contrast injection of right elbow joint Device: Samsung HS60  Verbal informed consent obtained.  Time-out conducted.  Noted no overlying erythema, induration, or other signs of local infection.  Skin prepped in a sterile fashion.  Local anesthesia: Topical Ethyl chloride.  With sterile technique and under real time ultrasound guidance: I advanced a 25-gauge needle into the elbow joint through the anconeus muscle, I then injected 1 cc kenalog 40, 1 cc lidocaine, 1 cc bupivacaine, syringe switched and 0.05 mL gadolinium injected, syringe again switched and approximately 3 to 5 mL of sterile saline used to distend the joint. Joint visualized and capsule seen distending confirming intra-articular placement of contrast material and medication. Completed without difficulty  Advised to call if fevers/chills, erythema, induration, drainage, or persistent bleeding.  Images permanently stored in PACS Impression: Technically successful ultrasound guided gadolinium contrast injection for MR arthrography.  Please see separate MR arthrogram report.  Independent interpretation of notes and tests performed by another provider:   None.  Brief History, Exam, Impression, and Recommendations:    Chronic elbow pain, right Please see prior notes, injection for elbow arthrography today.  Update: MR arthrography completely normal, adding formal physical therapy.    ___________________________________________ Ihor Austin. Benjamin Stain, M.D., ABFM., CAQSM. Primary Care and Sports Medicine [facility]  Adjunct Instructor of Family Medicine  [employer]

## 2020-10-10 NOTE — Addendum Note (Signed)
Addended by: Monica Becton on: 10/10/2020 12:47 PM   Modules accepted: Orders

## 2021-03-14 ENCOUNTER — Encounter: Payer: Self-pay | Admitting: Sports Medicine

## 2021-03-16 ENCOUNTER — Telehealth: Payer: Self-pay | Admitting: General Practice

## 2021-03-16 NOTE — Telephone Encounter (Signed)
Transition Care Management Follow-up Telephone Call Date of discharge and from where: 03/14/21 from [facility] How have you been since you were released from the hospital? No dizziness but still feels like he has no energy. Any questions or concerns? No  Items Reviewed: Did the pt receive and understand the discharge instructions provided? Yes  Medications obtained and verified? No  Other? No  Any new allergies since your discharge? No  Dietary orders reviewed? Yes Do you have support at home? Yes   Home Care and Equipment/Supplies: Were home health services ordered? no  Functional Questionnaire: (I = Independent and D = Dependent) ADLs: I  Bathing/Dressing- I  Meal Prep- I  Eating- I  Maintaining continence- I  Transferring/Ambulation- I  Managing Meds- I  Follow up appointments reviewed:  PCP Hospital f/u appt confirmed? Yes  Scheduled to see Iran Planas, PA on 03/19/21 @ 1300. [facility] f/u appt confirmed? No   Are transportation arrangements needed? No  If their condition worsens, is the pt aware to call PCP or go to the Emergency Dept.? Yes Was the patient provided with contact information for the PCP's office or ED? Yes Was to pt encouraged to call back with questions or concerns? Yes

## 2021-03-19 ENCOUNTER — Other Ambulatory Visit: Payer: Self-pay

## 2021-03-19 ENCOUNTER — Encounter: Payer: Self-pay | Admitting: Physician Assistant

## 2021-03-19 ENCOUNTER — Ambulatory Visit: Payer: 59 | Admitting: Physician Assistant

## 2021-03-19 VITALS — BP 133/82 | HR 70 | Ht 66.0 in | Wt 129.0 lb

## 2021-03-19 DIAGNOSIS — R569 Unspecified convulsions: Secondary | ICD-10-CM

## 2021-03-19 DIAGNOSIS — Z820 Family history of epilepsy and other diseases of the nervous system: Secondary | ICD-10-CM | POA: Insufficient documentation

## 2021-03-19 NOTE — Patient Instructions (Signed)
Make referral to neurology.

## 2021-03-19 NOTE — Progress Notes (Signed)
° °  Subjective:    Patient ID: Luis Boyd, male    DOB: 12/10/1985, 36 y.o.   MRN: [id number]  HPI Pt is a 36 yo male with no significant medical history but a family history of epilepsy who presents to the clinic to follow up after ED visit for syncope and seizure like activity.   Pt remembers working hard on 2/1 and not drinking a lot of fluids and coming home hitting his left elbow and then blacking out. His girlfriend said he hit is head on stool and floor. When he came too he was talking out of his head and then passed out again and made a choking noise and started abnormal movements and urinated on himself.   She took him to ED and EKG and CT and labs were normal. He was given fluids and felt better. He felt light headed for a few days but feels fine now and no more events. He admits to taking delta 8 THC but has stopped since this happened. No other medications or drugs.  .. 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     Objective:   Physical Exam Vitals reviewed.  Constitutional:      Appearance: Normal appearance. He is obese.  HENT:     Head: Normocephalic.  Cardiovascular:     Rate and Rhythm: Normal rate and regular rhythm.     Pulses: Normal pulses.     Heart sounds: Normal heart sounds.  Pulmonary:     Effort: Pulmonary effort is normal.     Breath sounds: Normal breath sounds.  Musculoskeletal:      Cervical back: Normal range of motion and neck supple.     Right lower leg: No edema.     Left lower leg: No edema.  Lymphadenopathy:     Cervical: No cervical adenopathy.  Neurological:     General: No focal deficit present.     Mental Status: He is alert and oriented to person, place, and time.     Motor: No weakness.     Coordination: Coordination normal.     Gait: Gait normal.  Psychiatric:        Mood and Affect: Mood normal.          Assessment & Plan:  Marland KitchenMarland KitchenAdelfo was seen today for hospitalization follow-up.  Diagnoses and all orders for this visit:  Witnessed seizure-like activity (HCC) -     Ambulatory referral to Neurology  Family history of seizure disorder -     Ambulatory referral to Neurology   Stopped Delta-8 THC.  Stay hydrated.  Will make referral to neurology for more work up and to consider EEG.  Reviewed labs/CT/EKG. No concerns.  Vitals look good today.  Let us know if any more seizure activity.  Avoid medications that lower seizure threshold.

## 2021-07-31 ENCOUNTER — Encounter (INDEPENDENT_AMBULATORY_CARE_PROVIDER_SITE_OTHER): Payer: 59 | Admitting: Sports Medicine

## 2021-07-31 DIAGNOSIS — M25521 Pain in right elbow: Secondary | ICD-10-CM

## 2021-07-31 DIAGNOSIS — G8929 Other chronic pain: Secondary | ICD-10-CM

## 2021-08-01 NOTE — Telephone Encounter (Signed)
I spent 5 total minutes of online digital evaluation and management services in this patient-initiated request for online care. 

## 2022-04-17 ENCOUNTER — Encounter: Payer: Self-pay | Admitting: Physician Assistant

## 2022-04-17 ENCOUNTER — Ambulatory Visit: Payer: Self-pay | Admitting: Physician Assistant

## 2022-04-17 VITALS — BP 116/70 | HR 63 | Ht 66.0 in | Wt 124.0 lb

## 2022-04-17 DIAGNOSIS — Z131 Encounter for screening for diabetes mellitus: Secondary | ICD-10-CM

## 2022-04-17 DIAGNOSIS — Z1329 Encounter for screening for other suspected endocrine disorder: Secondary | ICD-10-CM

## 2022-04-17 DIAGNOSIS — R569 Unspecified convulsions: Secondary | ICD-10-CM

## 2022-04-17 DIAGNOSIS — R55 Syncope and collapse: Secondary | ICD-10-CM | POA: Insufficient documentation

## 2022-04-17 DIAGNOSIS — Z820 Family history of epilepsy and other diseases of the nervous system: Secondary | ICD-10-CM

## 2022-04-17 DIAGNOSIS — Z13220 Encounter for screening for lipoid disorders: Secondary | ICD-10-CM

## 2022-04-17 NOTE — Patient Instructions (Signed)
Referral to neurology Get labs

## 2022-04-17 NOTE — Progress Notes (Signed)
Established Patient Office Visit  Subjective   Patient ID: Luis Boyd, male    DOB: 1986/01/27  Age: 37 y.o. MRN: [id number]  Chief Complaint  Patient presents with   Near Syncope    HPI Pt is a 37 yo male who presents to the clinic to follow up on syncopal episodes. He was last seen 03/19/2021 after taking a delta-8 gummy and not being hydrated and blacking out and hitting his head and then seizure like activity. He went to ED with normal EKG and CT. He has not had event like that since and not taken any more delta-8 gummies.   The last events have been:  After having ear lobe hole stretched Chipping his tooth on cheerio Having issue with upper molar dry socket  The rest of occurrence he has felt "hot and clammy, weak, pale" then he has syncope for 5-10 seconds and he is back up.  No CP, palpitations, dizziness, headaches.  .. 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    Objective:     BP 116/70   Pulse 63   Ht '5\' 6"'$  (1.676 m)   Wt 124 lb (56.2 kg)   SpO2 100%   BMI 20.01 kg/m  BP Readings from Last 3 Encounters:  04/17/22 116/70  03/19/21 133/82  02/24/19 (!) 147/78   Wt Readings from Last 3 Encounters:  04/17/22 124 lb (56.2 kg)  03/19/21 129 lb (58.5 kg)  11/26/19 131 lb (59.4 kg)    .Marland KitchenOrthostatic VS for the past 24 hrs (Last 3 readings):  BP- Lying Pulse- Lying BP- Sitting Pulse- Sitting BP- Standing at 0  minutes Pulse- Standing at 0 minutes  04/17/22 0932 121/75 61 135/79 62 128/88 69     Physical Exam Constitutional:      Appearance: Normal appearance. He is obese.  HENT:     Head: Normocephalic.  Cardiovascular:     Rate and Rhythm: Normal rate and regular rhythm.     Pulses: Normal pulses.     Heart sounds: Normal heart sounds.  Pulmonary:     Effort: Pulmonary effort is normal.     Breath sounds: Normal breath sounds.  Musculoskeletal:     Right lower leg: No edema.     Left lower leg: No edema.  Lymphadenopathy:     Cervical: No cervical adenopathy.  Neurological:     General: No focal deficit present.     Mental Status: He is alert and oriented to person, place, and time.  Psychiatric:        Mood and Affect: Mood normal.         Assessment & Plan:  Marland KitchenMarland KitchenJoniel was seen today for near syncope.  Diagnoses and all orders for this visit:  Vasovagal syncope -     Magnesium -     CBC w/Diff/Platelet -     TSH -     Cancel: Ambulatory referral to Neurosurgery -  Ambulatory referral to Neurology  Screening for lipid disorders -     Lipid Panel w/reflex Direct LDL  Screening for diabetes mellitus -     COMPLETE METABOLIC PANEL WITH GFR  Thyroid disorder screening -     TSH  Witnessed seizure-like activity ([place]) -     Cancel: Ambulatory referral to Neurosurgery -     Ambulatory referral to Neurology  Family history of seizure disorder -     Cancel: Ambulatory referral to Neurosurgery -     Ambulatory referral to Neurology   Last 3 episodes sound like vasovagal syncope No orthostatic changes HO given with prevention and education Need screening fasting labs and electrolytes  Concern with family history and the one episode of seizure like activity  Recommend neurology consult   Luis Planas, PA-C

## 2022-04-19 ENCOUNTER — Ambulatory Visit: Payer: 59 | Admitting: Sports Medicine

## 2022-07-31 ENCOUNTER — Encounter: Payer: Self-pay | Admitting: Physician Assistant

## 2022-07-31 DIAGNOSIS — Z820 Family history of epilepsy and other diseases of the nervous system: Secondary | ICD-10-CM

## 2022-07-31 DIAGNOSIS — R569 Unspecified convulsions: Secondary | ICD-10-CM

## 2022-08-02 NOTE — Addendum Note (Signed)
Addended by: Chalmers Cater on: 08/02/2022 01:34 PM   Modules accepted: Orders

## 2022-08-02 NOTE — Addendum Note (Signed)
Addended byJomarie Longs on: 08/02/2022 03:00 PM   Modules accepted: Orders

## 2022-08-02 NOTE — Telephone Encounter (Signed)
Left pt a VM to call back and schedule his F/u on meds and discuss the referral he is requesting

## 2022-08-21 ENCOUNTER — Encounter: Payer: Self-pay | Admitting: Physician Assistant

## 2022-08-22 ENCOUNTER — Ambulatory Visit (INDEPENDENT_AMBULATORY_CARE_PROVIDER_SITE_OTHER): Payer: BC Managed Care – PPO

## 2022-08-22 ENCOUNTER — Encounter: Payer: Self-pay | Admitting: Physician Assistant

## 2022-08-22 ENCOUNTER — Ambulatory Visit: Payer: BC Managed Care – PPO | Admitting: Physician Assistant

## 2022-08-22 VITALS — BP 143/90 | HR 87 | Ht 66.0 in | Wt 119.0 lb

## 2022-08-22 DIAGNOSIS — M79675 Pain in left toe(s): Secondary | ICD-10-CM

## 2022-08-22 DIAGNOSIS — M79672 Pain in left foot: Secondary | ICD-10-CM | POA: Diagnosis not present

## 2022-08-22 DIAGNOSIS — R55 Syncope and collapse: Secondary | ICD-10-CM | POA: Diagnosis not present

## 2022-08-22 DIAGNOSIS — S99922A Unspecified injury of left foot, initial encounter: Secondary | ICD-10-CM | POA: Diagnosis not present

## 2022-08-22 NOTE — Patient Instructions (Signed)
Ok to use ibuprofen and ice Wear post op shoe for 2-4 weeks until you can bear weight without pain then move to good supportive shoe Get xray of foot downstairs   Toe Fracture A toe fracture is a break in one of the toe bones (phalanges). A toe fracture may be: A crack in the surface of the bone (stress fracture). This often occurs in athletes. A break all the way through the bone (complete fracture). What are the causes? This condition may be caused by: Direct impact, such as from dropping a heavy object on your toe. Stubbing your toe. Twisting or stretching your toe out of place. Overuse or repeated exercise. What increases the risk? You are more likely to develop this condition if you: Play contact sports. Have weak bones (osteoporosis). Have a low calcium level. What are the signs or symptoms? The main symptoms of this condition are swelling and pain in the toe. Other symptoms include: Bruising. Stiffness. Numbness. A change in the way the toe looks. Broken bones that poke through the skin. Blood under the toenail. How is this diagnosed? This condition is diagnosed with a physical exam. You may also have X-rays. How is this treated? Treatment for this condition depends on the type of fracture and its severity. Treatment may include: Taping the broken toe to a toe that is next to it (buddy taping). This is the most common treatment for fractures when the bone has not moved out of place (non-displaced fracture). Wearing a shoe that has a wide, rigid sole to protect the toe and to limit its movement. Wearing a walking cast. A procedure to move the toe back into place. Surgery. This may be needed if: The pieces of broken bone are out of place (displaced). The bone breaks through the skin. Physical therapy exercises to improve movement and strength in the toe. You may need follow-up X-rays to make sure that the bone is healing well and staying in position. Follow these  instructions at home: If you have a removable shoe: Wear the shoe as told by your health care provider. Remove it only as told by your provider. Check the skin around the shoe every day. Tell your provider about any concerns. Loosen the shoe if your toes tingle, become numb, or turn cold and blue. Keep the shoe clean and dry. If you have a nonremovable cast: Do not put pressure on any part of the cast until it is fully hardened. This may take several hours. Do not stick anything inside the cast to scratch your skin. Doing that increases your risk of infection. Check the skin around the cast every day. Tell your provider about any concerns. You may put lotion on dry skin around the edges of the cast. Do not put lotion on the skin underneath the cast. You may put lotion on dry skin around the edges of the cast. Do not put lotion on the skin underneath the cast. Keep the cast clean and dry. Bathing Do not take baths, swim, or use a hot tub until your provider approves. Ask your provider if you may take showers. If the shoe or cast is not waterproof: Do not let it get wet. Cover it with a watertight covering when you take a bath or shower. Activity Do not use the injured foot to support your body weight until your provider says that you can. Use crutches as told by your provider. Ask your provider: What activities are safe for you during recovery. What activities  you need to avoid. Do physical therapy exercises as told by your provider. Driving Ask your provider if the medicine prescribed to you requires you to avoid driving or using machinery. Do not drive while wearing a cast on a foot that you use for driving. Managing pain, stiffness, and swelling  If told, put ice on the injured area. If you have a removable shoe, remove it as told by your provider. Put ice in a plastic bag. Place a towel between your skin and the bag or between your cast and the bag. Leave the ice on for 20  minutes, 2-3 times a day. If your skin turns bright red, remove the ice right away to prevent skin damage. The risk of damage is higher if you cannot feel pain, heat, or cold. Raise (elevate) the injured area above the level of your heart while you are sitting or lying down. General instructions If your toe was treated with buddy taping, follow your provider's instructions for changing the gauze and tape. Change it more often if: The gauze and tape get wet. If this happens, dry the space between the toes. The gauze and tape are too tight and cause your toe to become pale or numb. If you were not given a protective shoe, wear sturdy, supportive shoes. Your shoes should not pinch your toes and should not fit tightly against your toes. Do not use any products that contain nicotine or tobacco. These products include cigarettes, chewing tobacco, and vaping devices, such as e-cigarettes. These can delay bone healing. If you need help quitting, ask your provider. Take over-the-counter and prescription medicines only as told by your provider. Keep all follow-up visits. Your provider will check to see how your toe is healing. Contact a health care provider if you have: Pain that gets worse or does not get better with medicine. A fever. A bad smell coming from your cast. Get help right away if you have: Any of the following in your toes or your foot: Numbness or tingling that gets worse. Coldness. Blue skin. Redness or swelling that gets worse. Pain that suddenly becomes severe. This information is not intended to replace advice given to you by your health care provider. Make sure you discuss any questions you have with your health care provider. Document Revised: 02/18/2022 Document Reviewed: 12/20/2021 Elsevier Patient Education  2024 [employer].

## 2022-08-22 NOTE — Progress Notes (Signed)
   Acute Office Visit  Subjective:     Patient ID: Luis Boyd, male    DOB: [birth year], 37 y.o.   MRN: [id number]  Chief Complaint  Patient presents with   Follow-up    Toe pain    HPI Patient is in today for left pinky toe pain that has not resolved since he had syncope event on June 19th. He has still not gone to neurology due to money issues. He has never had EEG to determine if he is having seizures. He has had 3 events like this but some speculation could be vasovagal. He does not urinate on himself or bite his mouth. He is only out for seconds. This most recent event he had been working out in hot sun and came back in and when he went to sit down collapsed in the floor and left foot/pinky toe got twisted on the floor and coffee table. Hurts to bear weight.   .. 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      Objective:    There were no vitals taken for this visit. BP Readings from Last 3 Encounters:  08/22/22 (!) 143/90  04/17/22 116/70  03/19/21 133/82   Wt Readings from Last 3 Encounters:  08/22/22 119 lb (54 kg)  04/17/22 124 lb (56.2 kg)  03/19/21 129 lb (58.5 kg)      Physical Exam  Left pinky toe at PIP tender, erythematous and slightly swollen to touch.  NROM of left foot.      Assessment & Plan:  Marland KitchenMarland KitchenVaughn was seen today for follow-up.  Diagnoses and all orders for this visit:  Pain of toe of left  foot -     DG Foot Complete Left; Future  Syncope, unspecified syncope type  Injury of left great toe, initial encounter   Needs to follow up with neurology for syncope.  Make sure to stay hydrated and eat plenty of salt.  Consider compression stocking as well to help with circulation.  Will get xray of toe Placed in post op shoe for 2-4 weeks until no pain with weight bearing Ibuprofen and ice as needed  Tandy Gaw, PA-C

## 2022-08-22 NOTE — Progress Notes (Signed)
No acute findings on xray that suggest fracture. Pain likely contusion vs extensor tendon sprain. Stay in post op shoe for 2-4 weeks until not painful to bear weight then move to good supportive shoe.

## 2022-08-23 ENCOUNTER — Encounter: Payer: Self-pay | Admitting: Physician Assistant

## 2022-08-26 ENCOUNTER — Encounter: Payer: Self-pay | Admitting: Physician Assistant

## 2022-08-26 NOTE — Telephone Encounter (Signed)
Attempted call to patient. Left voice mail message requesting a return call.  

## 2022-08-26 NOTE — Telephone Encounter (Signed)
Attempted call to patient. Left a voice mail message requesting a return call.  

## 2022-08-29 ENCOUNTER — Encounter: Payer: Self-pay | Admitting: Physician Assistant

## 2022-08-31 ENCOUNTER — Encounter: Payer: Self-pay | Admitting: Physician Assistant

## 2022-08-31 DIAGNOSIS — M79675 Pain in left toe(s): Secondary | ICD-10-CM

## 2022-08-31 DIAGNOSIS — S99922A Unspecified injury of left foot, initial encounter: Secondary | ICD-10-CM

## 2022-09-02 MED ORDER — DICLOFENAC SODIUM 75 MG PO TBEC: 75.0000 mg | DELAYED_RELEASE_TABLET | Freq: Two times a day (BID) | ORAL | 0 refills | Status: DC

## 2022-09-02 NOTE — Telephone Encounter (Signed)
yes

## 2022-09-06 ENCOUNTER — Ambulatory Visit: Payer: BC Managed Care – PPO | Admitting: Podiatry

## 2022-09-06 ENCOUNTER — Encounter: Payer: Self-pay | Admitting: Podiatry

## 2022-09-06 DIAGNOSIS — S9032XA Contusion of left foot, initial encounter: Secondary | ICD-10-CM | POA: Diagnosis not present

## 2022-09-06 DIAGNOSIS — S90122A Contusion of left lesser toe(s) without damage to nail, initial encounter: Secondary | ICD-10-CM

## 2022-09-06 NOTE — Progress Notes (Signed)
  Subjective:  Patient ID: Luis Boyd, male    DOB: Oct 22, 1985,   MRN: [id number]  Chief Complaint  Patient presents with   Foot Pain    Pt states he has been having pain on the left side of his left foot he states that the pain started after he has a seizure and hit himself on something. Pt also says that the next day he went on a hike and works 8 hours a day ojn concrete so he thinks that is why it does not get better.     37 y.o. male presents for concern of left pinky toe pain that started a couple weeks ago. Relates he had a syncopal event and when came to noted that he had pain in swelling in his pinky toe and it was twisted. He was seen by PCP and X-rays taken and did not note any fracture. He was placed ina  boot for 2-4 weeks. Relates it is still sore but maybe some improvement.  Denies any other pedal complaints. Denies n/v/f/c.   No past medical history on file.  Objective:  Physical Exam: Vascular: DP/PT pulses 2/4 bilateral. CFT <3 seconds. Normal hair growth on digits. No edema.  Skin. No lacerations or abrasions bilateral feet.  Musculoskeletal: MMT 5/5 bilateral lower extremities in DF, PF, Inversion and Eversion. Deceased ROM in DF of ankle joint. Tender to the proximal phalanx of the fifth digit. Mild edema noted in this area.  Neurological: Sensation intact to light touch.   Assessment:   1. Contusion of left foot including toes, initial encounter      Plan:  Patient was evaluated and treated and all questions answered. -Xrays reviewed. No obvious fracture noted although possible lucency noted in the proximal phalanx of the fifth digit in the central distal aspect extending to the articular surface.  -Discussed treatement options for toe fracture vs contusions and tendon sprain; risks, alternatives, and benefits explained. -Continue surgical shoe for two more weeks then may begin to transition to regular shoes.  -Recommend protection, rest, ice, elevation daily  until symptoms improve -Anti-inflammatories as needed -Patient to return to office as needed in future     Louann Sjogren, DPM

## 2022-09-10 MED ORDER — TRAMADOL HCL 50 MG PO TABS: 50.0000 mg | ORAL_TABLET | Freq: Four times a day (QID) | ORAL | 0 refills | Status: AC | PRN

## 2022-09-10 NOTE — Telephone Encounter (Signed)
..  PDMP reviewed during this encounter. No concerns or recent controlled substances.  Sent tramadol to use as needed for foot pain. Continue ibuprofen.

## 2022-09-10 NOTE — Addendum Note (Signed)
Addended by: Jomarie Longs on: 09/10/2022 12:50 PM   Modules accepted: Orders

## 2022-09-13 ENCOUNTER — Ambulatory Visit: Payer: BC Managed Care – PPO | Admitting: Podiatry

## 2022-09-26 ENCOUNTER — Ambulatory Visit (INDEPENDENT_AMBULATORY_CARE_PROVIDER_SITE_OTHER): Payer: BC Managed Care – PPO | Admitting: Physician Assistant

## 2022-09-26 VITALS — Ht 66.0 in | Wt 119.0 lb

## 2022-09-26 DIAGNOSIS — M79675 Pain in left toe(s): Secondary | ICD-10-CM

## 2022-09-26 DIAGNOSIS — S90122A Contusion of left lesser toe(s) without damage to nail, initial encounter: Secondary | ICD-10-CM | POA: Diagnosis not present

## 2022-09-26 DIAGNOSIS — S9032XD Contusion of left foot, subsequent encounter: Secondary | ICD-10-CM

## 2022-09-26 NOTE — Progress Notes (Signed)
Pt presented for sizing of new walking boot. Pt given M walking boot. He states his foot feels better in the boot. Not as painful as the post-op shoe.

## 2022-09-27 NOTE — Progress Notes (Signed)
Pt has been in post op shoe but still having pain. He was placed in boot today. Follow up with podiatry in 2 weeks. May need to wear boot for up to 6 weeks.

## 2022-10-01 ENCOUNTER — Encounter: Payer: Self-pay | Admitting: Physician Assistant

## 2022-10-01 MED ORDER — TRAMADOL HCL 50 MG PO TABS: 50.0000 mg | ORAL_TABLET | Freq: Four times a day (QID) | ORAL | 0 refills | Status: AC | PRN

## 2022-10-01 NOTE — Telephone Encounter (Signed)
..  PDMP reviewed during this encounter. No concerns Sent tramadol as needed.

## 2022-10-22 ENCOUNTER — Encounter: Payer: Self-pay | Admitting: Physician Assistant

## 2022-10-24 ENCOUNTER — Encounter: Payer: Self-pay | Admitting: Sports Medicine

## 2022-10-24 ENCOUNTER — Ambulatory Visit: Payer: BC Managed Care – PPO | Admitting: Sports Medicine

## 2022-10-24 DIAGNOSIS — G8929 Other chronic pain: Secondary | ICD-10-CM

## 2022-10-24 DIAGNOSIS — M79672 Pain in left foot: Secondary | ICD-10-CM | POA: Diagnosis not present

## 2022-10-24 MED ORDER — IBUPROFEN 800 MG PO TABS: 800.0000 mg | ORAL_TABLET | Freq: Three times a day (TID) | ORAL | 2 refills | Status: DC | PRN

## 2022-10-24 NOTE — Assessment & Plan Note (Addendum)
Pleasant 37 year old male, he has had about a month of pain left foot along the fifth toe, specifically he localizes the pain proximal to the fifth MTP. He recalls an episode of passing out and hitting his foot, he had some x-rays done that were negative, he did see podiatry, he has been in a boot since. On exam he has tenderness on the fifth metatarsal shaft. 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 Based on his symptoms I do not think that his syncopal episode has anything to do with his toe pain and I think it is more related to a stress injury. Switching him diclofenac to prescription strength ibuprofen. He does understand he still needs to work up his syncope with his PCP, he will continue the boot and I am going to go and pull the trigger for an MRI, we will also shorten his workdays. Return to see me to go over MRI results.  Update: MRI is completely normal, I am not able to explain his foot pain.  Will do nerve conduction and EMG to ensure we are not dealing with a radicular process.  If all of the above is normal then we will not be able to justify keeping him out of work.

## 2022-10-24 NOTE — Progress Notes (Addendum)
    Procedures performed today:    None.  Independent interpretation of notes and tests performed by another provider:   I did personally review the x-rays, there is potentially periosteal reaction at the fifth metatarsal neck  Brief History, Exam, Impression, and Recommendations:    Chronic pain in left foot Pleasant 37 year old male, he has had about a month of pain left foot along the fifth toe, specifically he localizes the pain proximal to the fifth MTP. He recalls an episode of passing out and hitting his foot, he had some x-rays done that were negative, he did see podiatry, he has been in a boot since. On exam he has tenderness on the fifth metatarsal shaft. 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 Based on his symptoms I do not think that his syncopal episode has anything to do with his toe pain and I think it is more related to a stress injury. Switching him diclofenac to prescription strength ibuprofen. He does understand he still needs to work up his syncope with his PCP, he will continue the boot and I am going to go and pull the trigger for an MRI, we will also shorten his workdays. Return to see me to go over MRI results.  Update: MRI is completely normal, I am not able to explain his foot pain.  Will do nerve conduction and EMG to ensure we are not dealing with a radicular process.  If all of the above is normal then we will not be able to justify keeping him out of work.  Chronic process not at goal with pharmacologic intervention  ____________________________________________ Ihor Austin. Benjamin Stain, M.D., ABFM., CAQSM., AME. Primary Care and Sports Medicine [facility]  Adjunct Professor of Family Medicine  [employer]  Restaurant manager, fast food

## 2022-11-14 ENCOUNTER — Encounter: Payer: Self-pay | Admitting: Sports Medicine

## 2022-11-19 ENCOUNTER — Encounter: Payer: Self-pay | Admitting: Sports Medicine

## 2022-11-21 NOTE — Telephone Encounter (Signed)
No need for a new order. I will contact the insurance to change the location of the imaging center.

## 2022-11-21 NOTE — Telephone Encounter (Signed)
Looks like we will need to change the MRI location, as this needs to be an arthrogram it will likely need to be done at [facility] imaging where we have interventional radiologist since I am the only sports doctor that does arthrograms in the health system.  Do I need to place a new order or can you just get the Auth for the different location?

## 2022-11-26 ENCOUNTER — Ambulatory Visit
Admission: RE | Admit: 2022-11-26 | Discharge: 2022-11-26 | Disposition: A | Discharge status: Home / Self Care | Payer: BC Managed Care – PPO | Source: Ambulatory Visit | Attending: Sports Medicine | Admitting: Sports Medicine

## 2022-11-26 DIAGNOSIS — G8929 Other chronic pain: Secondary | ICD-10-CM

## 2022-11-26 DIAGNOSIS — M79672 Pain in left foot: Secondary | ICD-10-CM | POA: Diagnosis not present

## 2022-11-30 ENCOUNTER — Encounter (INDEPENDENT_AMBULATORY_CARE_PROVIDER_SITE_OTHER): Payer: Self-pay | Admitting: Sports Medicine

## 2022-11-30 ENCOUNTER — Other Ambulatory Visit: Payer: BC Managed Care – PPO

## 2022-11-30 DIAGNOSIS — M79672 Pain in left foot: Secondary | ICD-10-CM | POA: Diagnosis not present

## 2022-11-30 DIAGNOSIS — G8929 Other chronic pain: Secondary | ICD-10-CM | POA: Diagnosis not present

## 2022-12-03 MED ORDER — TRAMADOL HCL 50 MG PO TABS: 50.0000 mg | ORAL_TABLET | Freq: Three times a day (TID) | ORAL | 0 refills | Status: DC | PRN

## 2022-12-03 MED ORDER — CYCLOBENZAPRINE HCL 10 MG PO TABS: ORAL_TABLET | ORAL | 0 refills | Status: DC

## 2022-12-03 NOTE — Telephone Encounter (Signed)
Please see the MyChart message reply(ies) for my assessment and plan.    This patient gave consent for this Medical Advice Message and is aware that it may result in a bill to [employer], as well as the possibility of receiving a bill for a co-payment or deductible. They are an established patient, but are not seeking medical advice exclusively about a problem treated during an in person or video visit in the last seven days. I did not recommend an in person or video visit within seven days of my reply.  I spent 11 total minutes of online digital evaluation and management services in this patient-initiated request for online care.

## 2022-12-19 NOTE — Addendum Note (Signed)
Addended by: Monica Becton on: 12/19/2022 12:42 PM   Modules accepted: Orders

## 2022-12-26 ENCOUNTER — Encounter: Payer: Self-pay | Admitting: Neurology

## 2022-12-26 ENCOUNTER — Other Ambulatory Visit: Payer: Self-pay

## 2022-12-26 DIAGNOSIS — R202 Paresthesia of skin: Secondary | ICD-10-CM

## 2022-12-31 MED ORDER — GABAPENTIN 300 MG PO CAPS
ORAL_CAPSULE | ORAL | 3 refills | Status: DC
Start: 2022-12-31 — End: 2023-03-14

## 2022-12-31 NOTE — Addendum Note (Signed)
Addended by: Monica Becton on: 12/31/2022 04:52 PM   Modules accepted: Orders

## 2022-12-31 NOTE — Telephone Encounter (Signed)

## 2023-01-20 ENCOUNTER — Ambulatory Visit: Payer: BC Managed Care – PPO | Admitting: Neurology

## 2023-01-20 ENCOUNTER — Encounter: Payer: Self-pay | Admitting: Neurology

## 2023-01-20 ENCOUNTER — Encounter (INDEPENDENT_AMBULATORY_CARE_PROVIDER_SITE_OTHER): Payer: Self-pay | Admitting: Sports Medicine

## 2023-01-20 DIAGNOSIS — R202 Paresthesia of skin: Secondary | ICD-10-CM | POA: Diagnosis not present

## 2023-01-20 DIAGNOSIS — G8929 Other chronic pain: Secondary | ICD-10-CM | POA: Diagnosis not present

## 2023-01-20 DIAGNOSIS — M79672 Pain in left foot: Secondary | ICD-10-CM

## 2023-01-20 NOTE — Telephone Encounter (Signed)

## 2023-01-20 NOTE — Procedures (Signed)
  [facility]  [address] [place], [address]  [place], [place] [address] Tel: [phone] Fax: [phone] Test Date:  01/20/2023  Patient: Luis Boyd DOB: 08-07-1985 Physician: Jacquelyne Balint, MD  Sex: Male Height: 5\' 6"  Ref Phys: Rodney Langton, MD  ID#: [id number]   Technician:    History: This is a 37 year old male with left foot pain.  NCV & EMG Findings: Extensive electrodiagnostic evaluation of the left lower limb shows: Left sural, superficial peroneal/fibular, medial plantar sensory responses are within normal limits. Left peroneal/fibular (EDB) and tibial (AH) motor responses are within normal limits. Left H reflex latency is within normal limits. There is no evidence of active or chronic motor axon loss changes affecting any of the tested muscles. Motor unit configuration and recruitment pattern is within normal limits.  Impression: This is a normal study of the left lower limb. Specifically: No electrodiagnostic evidence of a left lumbosacral (L3-S1) motor radiculopathy. No electrodiagnostic evidence of a large fiber sensorimotor neuropathy. No electrodiagnostic evidence of left tarsal tunnel syndrome.    ___________________________ Jacquelyne Balint, MD    Nerve Conduction Studies Motor Nerve Results    Latency Amplitude F-Lat Segment Distance CV Comment  Site (ms) Norm (mV) Norm (ms)  (cm) (m/s) Norm   Left Fibular (EDB) Motor  Ankle 3.2  < 5.5 4.5  > 3.0        Bel fib head 8.9 - 4.4 -  Bel fib head-Ankle 28 49  > 40   Pop fossa 10.7 - 4.1 -  Pop fossa-Bel fib head 9 50 -   Left Tibial (AH) Motor  Ankle 2.5  < 6.0 22.1  > 8.0        Knee 9.4 - 18.2 -  Knee-Ankle - -  > 40    Sensory Sites    Neg Peak Lat Amplitude (O-P) Segment Distance Velocity Comment  Site (ms) Norm (V) Norm  (cm) (ms)   Left Medial Plantar (Ortho) Sensory  Great toe-Med mall 3.4 - 13 - Great toe-Med mall -    Left Superficial Fibular Sensory  14 cm-Ankle 2.4  < 4.5 21   > 5 14 cm-Ankle 14    Left Sural Sensory  Calf-Lat mall 3.4  < 4.5 12  > 5 Calf-Lat mall 14     H-Reflex Results    M-Lat H Lat H Neg Amp H-M Lat  Site (ms) (ms) Norm (mV) (ms)  Left Tibial H-Reflex  Pop fossa 5.3 30.9  < 35.0 2.6 25.6   Electromyography   Side Muscle Ins.Act Fibs Fasc Recrt Amp Dur Poly Activation Comment  Left Tib ant Nml Nml Nml Nml Nml Nml Nml Nml N/A  Left Gastroc MH Nml Nml Nml Nml Nml Nml Nml Nml N/A  Left FDL Nml Nml Nml Nml Nml Nml Nml Nml N/A  Left EDB Nml Nml Nml N.E. - - - N.E. N/A  Left AH Nml Nml Nml N.E. - - - N.E. N/A  Left Rectus fem Nml Nml Nml Nml Nml Nml Nml Nml N/A  Left Biceps fem SH Nml Nml Nml Nml Nml Nml Nml Nml N/A  Left Gluteus med Nml Nml Nml Nml Nml Nml Nml Nml N/A      Waveforms:  Motor      Sensory        H-Reflex

## 2023-01-24 ENCOUNTER — Encounter: Payer: Self-pay | Admitting: Sports Medicine

## 2023-01-27 ENCOUNTER — Encounter: Payer: BC Managed Care – PPO | Admitting: Neurology

## 2023-01-27 ENCOUNTER — Encounter: Payer: Self-pay | Admitting: Sports Medicine

## 2023-03-13 ENCOUNTER — Encounter: Payer: Self-pay | Admitting: Sports Medicine

## 2023-03-13 DIAGNOSIS — G8929 Other chronic pain: Secondary | ICD-10-CM

## 2023-03-14 MED ORDER — GABAPENTIN 300 MG PO CAPS: 300.0000 mg | ORAL_CAPSULE | Freq: Three times a day (TID) | ORAL | 3 refills | Status: DC

## 2023-08-08 ENCOUNTER — Ambulatory Visit

## 2023-10-14 ENCOUNTER — Encounter: Payer: Self-pay | Admitting: Sports Medicine

## 2023-12-04 ENCOUNTER — Encounter: Payer: Self-pay | Admitting: Physician Assistant

## 2023-12-04 DIAGNOSIS — Z3009 Encounter for other general counseling and advice on contraception: Secondary | ICD-10-CM

## 2023-12-16 ENCOUNTER — Encounter: Payer: Self-pay | Admitting: Neurology

## 2024-01-14 ENCOUNTER — Encounter: Payer: Self-pay | Admitting: Physician Assistant

## 2024-01-22 ENCOUNTER — Telehealth: Payer: Self-pay

## 2024-01-22 NOTE — Telephone Encounter (Signed)
 LM advising patient to call in to check availability of jades schedule at that day and time.

## 2024-01-22 NOTE — Telephone Encounter (Signed)
 Called patient and left a voice mail message that Luis Boyd was out of the office this Friday 01/23/2024. Left our main phone line #  as return call contact for patient for scheduling a visit.

## 2024-01-23 NOTE — Telephone Encounter (Signed)
 Called patient and left another voice mail requesting a return call to schedule an appt - informed of voice mail that the office is not open on Saturday's. Left our main # as return contact # .

## 2024-01-27 ENCOUNTER — Encounter: Payer: Self-pay | Admitting: Urology

## 2024-01-27 ENCOUNTER — Ambulatory Visit: Admitting: Urology

## 2024-01-27 VITALS — BP 130/85 | HR 90 | Ht 66.0 in | Wt 135.0 lb

## 2024-01-27 DIAGNOSIS — Z3009 Encounter for other general counseling and advice on contraception: Secondary | ICD-10-CM | POA: Diagnosis not present

## 2024-01-27 NOTE — Patient Instructions (Signed)
 Pre-Vasectomy Instructions  STOP all aspirin or blood thinners (Aspirin, Plavix, Coumadin, Warfarin, Motrin, Ibuprofen, Advil, Aleve, Naproxen, Naprosyn) for 7 days prior to the procedure.  If you have any questions about stopping these medications please contact your primary care physician or cardiologist.  Shave all hair from the upper scrotum on the day of the procedure.  This means just under the penis onto the scrotal sac.  The area shaved should measure about 2-3 inches around.  You may lather the scrotum with soap and water, and shave with a safety razor.  After shaving the area, thoroughly wash the penis and the scrotum, then shower or bathe to remove all the loose hairs.  If needed, wash the area again just before coming in for your Vasectomy.  It is recommended to have a light meal an hour or so prior to the procedure.  Bring a scrotal support (jock strap or suspensory, or tight jockey shorts or underwear).  Wear comfortable pants or shorts.  While the actual procedure usually takes about 45 minutes, you should be prepared to stay in the office for approximately one hour.  Bring someone with you to drive you home.  If you have any questions or concerns, please feel free to call the office at [phone].

## 2024-01-27 NOTE — Progress Notes (Unsigned)
 01/27/2024 5:18 PM   Luis Boyd 1985/09/20 [id number]  Referring provider: Antoniette Vermell CROME, PA-C [address] [place],  [place] [address]  Chief Complaint  Patient presents with   VAS Consult    HPI: Luis Boyd is a 38 y.o. male who presents for vasectomy counseling.  He is married.  He and his wife have no children and had decided before marriage that they did not desire children Denies prior history urologic problems including chronic scrotal pain, epididymitis or orchitis No previous history inguinal hernia or pelvic surgery No history of bleeding or clotting disorders   PMH: History reviewed. No pertinent past medical history.  Surgical History: History reviewed. No pertinent surgical history.  Home Medications:  Allergies as of 01/27/2024   No Known Allergies      Medication List        Accurate as of January 27, 2024  5:18 PM. If you have any questions, ask your nurse or doctor.          STOP taking these medications    cyclobenzaprine  10 MG tablet Commonly known as: FLEXERIL  Stopped by: Glendia Barba, MD   gabapentin  300 MG capsule Commonly known as: NEURONTIN  Stopped by: Glendia Barba, MD   ibuprofen  800 MG tablet Commonly known as: ADVIL  Stopped by: Glendia Barba, MD   traMADol  50 MG tablet Commonly known as: ULTRAM  Stopped by: Glendia Barba, MD        Allergies: Allergies[1]  Family History: Family History  Problem Relation Age of Onset   Hypertension Brother     Social History:  reports that he quit smoking about 13 years ago. His smoking use included cigarettes. He has never used smokeless tobacco. He reports that he does not drink alcohol and does not use drugs.   Physical Exam: BP 130/85   Pulse 90   Ht 5' 6 (1.676 m)   Wt 135 lb (61.2 kg)   BMI 21.79 kg/m   Constitutional:  Alert and oriented, No acute distress. HEENT: [place] AT Respiratory: Normal respiratory effort, no increased work of  breathing. GI: Abdomen is soft, nontender, nondistended, no abdominal masses GU: Phallus without lesions, testes descended bilaterally without masses or tenderness, spermatic cord/epididymis palpably normal bilaterally.  Vasa palpable bilaterally Psychiatric: Normal mood and affect.   Assessment & Plan:    1.  Undesired fertility Desires to schedule vasectomy We had a long discussion about vasectomy. We specifically discussed the procedure, recovery and the risks, benefits and alternatives of vasectomy. I explained that the procedure entails removal of a segment of each vas deferens, each of which conducts sperm, and that the purpose of this procedure is to cause sterility (inability to produce children or cause pregnancy). Vasectomy is intended to be permanent and irreversible form of contraception. Options for fertility after vasectomy include vasectomy reversal, or sperm retrieval with in vitro fertilization. These options are not always successful, and they may be expensive. We discussed reversible forms of birth control such as condoms, IUD or diaphragms, as well as the option of freezing sperm in a sperm bank prior to the vasectomy procedure. We discussed the importance of avoiding strenuous exercise for four days after vasectomy, and the importance of refraining from any form of ejaculation for seven days after vasectomy. I explained that vasectomy does not produce immediate sterility so another form of contraceptive must be used until sterility is assured by having semen checked for sperm. Thus, a post vasectomy semen analysis is necessary  to confirm sterility. Rarely, vasectomy must be repeated. We discussed the approximately 1 in 2,000 risk of pregnancy after vasectomy for men who have post-vasectomy semen analysis showing absent sperm or rare non-motile sperm. Typical side effects include a small amount of oozing blood, some discomfort and mild swelling in the area of incision.  Vasectomy does  not affect sexual performance, function, please, sensation, interest, desire, satisfaction, penile erection, volume of semen or ejaculation. Other rare risks include allergy or adverse reaction to an anesthetic, testicular atrophy, hematoma, infection/abscess, prolonged tenderness of the vas deferens, pain, swelling, painful nodule or scar (called sperm granuloma) or epididymtis. We discussed chronic testicular pain syndrome. This has been reported to occur in as many as 1-2% of men and may be permanent. This can be treated with medication, small procedures or (rarely) surgery. Valium 10 mg as a preprocedure anxiolytic sent to pharmacy and he was informed he would need a driver if utilizing this medication ***send Rx [id number]   Glendia JAYSON Barba, MD  [facility] [address] [place], [place] [address] [phone]     [1] No Known Allergies

## 2024-01-28 ENCOUNTER — Encounter: Payer: Self-pay | Admitting: Urology

## 2024-03-01 ENCOUNTER — Encounter: Payer: Self-pay | Admitting: Urology

## 2024-03-03 ENCOUNTER — Ambulatory Visit (INDEPENDENT_AMBULATORY_CARE_PROVIDER_SITE_OTHER): Admitting: Urology

## 2024-03-03 VITALS — BP 128/77 | HR 100 | Ht 66.0 in | Wt 130.0 lb

## 2024-03-03 DIAGNOSIS — Z9852 Vasectomy status: Secondary | ICD-10-CM | POA: Diagnosis not present

## 2024-03-03 DIAGNOSIS — Z302 Encounter for sterilization: Secondary | ICD-10-CM

## 2024-03-03 NOTE — Progress Notes (Signed)
" ° °  Vasectomy Procedure Note  Indications: Luis Boyd is a 39 y.o. male who presents today for elective sterilization.  He has been consented for the procedure.  He is aware of the risks and benefits.  He had no additional questions.  He agrees to proceed.  He denies any other significant change since his last visit.  Pre-operative Diagnosis: Elective sterilization  Post-operative Diagnosis: Elective sterilization  Premedication: Valium 10 mg po  Surgeon: Glendia C. Leyanna Bittman, M.D  Description: The patient was prepped and draped in the standard fashion.  The right vas deferens was identified and brought superiorly to the anterior scrotal skin.  The skin and vas were then anesthetized utilizing 3 ml 1% lidocaine.  A small stab incision was made and spread with the vas dissector.  The vas was grasped utilizing the vas clamp and elevated out of the incision. The vas sheath was incised and the vas was dissected out of the sheath, elevated and dissected free from the surrounding tissue and vessels.  A 1 cm segment was excised. The vas lumens were cauterized with a needle tip electrocautery for a distance of at least 1 cm.  Fascial interposition was performed on the testicular end with a figure-of-eight 3-0 chromic suture.   No significant bleeding was observed.  The vas ends were then dropped back into the hemiscrotum.  The skin was closed with hemostatic pressure.  An identical procedure was performed on the contralateral side.  Clean dry gauze was applied to the incision sites.  The patient tolerated the procedure well.  Complications:None  Recommendations: 1.  No lifting greater than 10 pounds or strenuous activity for 1 week. 2.  Scrotal support for 1-2 weeks. 3.  May shower in 24 hours; no bath, hot tub for 1 week 4.  No intercourse for at least 7 days and resume based on level of discomfort  5.  Continue alternate contraception for 12 weeks.  6.  Call for significant pain, swelling,  redness, drainage or fever greater than 100.5. 7.  Ibuprofen /acetaminophen prn pain. 8.  Follow-up semen analysis in 12 weeks.   Glendia Barba, MD  "

## 2024-03-03 NOTE — Patient Instructions (Signed)

## 2024-03-04 ENCOUNTER — Encounter: Payer: Self-pay | Admitting: *Deleted

## 2024-03-16 ENCOUNTER — Encounter: Payer: Self-pay | Admitting: Physician Assistant

## 2024-03-16 NOTE — Telephone Encounter (Signed)
 This really should come from his urologist but if we have form we documentation of vasectomy so if get it to me I can fill out.

## 2024-06-01 ENCOUNTER — Other Ambulatory Visit
